# Patient Record
Sex: Female | Born: 2000 | Hispanic: Yes | Marital: Single | Smoking: Never smoker
Health system: Southern US, Community
[De-identification: ages and names within clinical notes are randomized; demographics above are authoritative.]

## PROBLEM LIST (undated history)

## (undated) DIAGNOSIS — J45909 Unspecified asthma, uncomplicated: Secondary | ICD-10-CM

## (undated) HISTORY — PX: APPENDECTOMY: SHX54

---

## 2020-04-28 NOTE — L&D Delivery Note (Addendum)
OB/GYN Faculty Practice Delivery Note  Cheryl Kline is a 20 y.o. G1P0 s/p VAVD at 103w0d. She was admitted for SOL.   ROM: 3h 71m with clear fluid GBS Status: positive>PCN Maximum Maternal Temperature: 98.2  Delivery Date/Time: 1610 Delivery: Called to room and patient was complete and pushing at a +2 station. Vacuum was placed by Dr. Macon Large due to fetal heart rate decelerations.  Head delivered ROA. No nuchal cord present. Shoulder and body delivered in usual fashion. Infant with spontaneous cry, placed on mother's abdomen, dried and stimulated. Cord clamped x 2 after 1-minute delay, and cut by FOB under my direct supervision. Cord blood drawn. Placenta delivered spontaneously with gentle cord traction. Low uterine segment boggy with several clots, evacuated via bimanual examination. Given dose of TXA. Fundus firm after fundal massage and Pitocin administration. Labia, perineum, vagina, and cervix were inspected, first degree laceration. Laceration was repaired with 3-0 vicryl.    Placenta: membranes intact, 3VC Complications: Vacuum assisted delivery 2/2 to fetal heart rate decelerations Lacerations: 1st degree laceration EBL: 200 Analgesia: Epidural and lidocaine  Infant: Baby boy  APGARs 9,9  weight pending  Betti Cruz, MD PGY-2 Family Medicine Resident   Attestation of Attending Supervision of Resident: I was immediately available for direct supervision, assistance and direction throughout this encounter.  I also confirm that I have verified the information documented in the resident's note, and that I personally performed the vacuum assisted vaginal delivery.  Prior to delivery, risks of vacuum assistance were discussed in detail, including but not limited to, bleeding, infection, damage to maternal tissues, fetal cephalohematoma, inability to effect vaginal delivery of the head or shoulder dystocia that cannot be resolved by established maneuvers and need for emergency cesarean  section.  Patient gave verbal consent.  The soft vacuum cup was positioned over the sagittal suture 3 cm anterior to posterior fontanelle.  Pressure was then increased to 500 mmHg, and the patient was instructed to push.  Pulling was administered along the pelvic curve while patient was pushing; there were 3 contractions and no popoffs.  Vacuum was reduced in between contractions.  The infant was then delivered atraumatically as described above. Apgars were 9 and 9. Rest of delivery proceeded as documented in resident's note above.  At the end of the delivery and laceration repair, sponge, instrument and needle counts were correct x 2.  The patient and baby were stable after delivery and remained in couplet care, with plans to transfer later to postpartum unit   Jaynie Collins, MD, FACOG Attending Obstetrician & Gynecologist, [facility] for [facility], [facility]

## 2020-11-02 ENCOUNTER — Telehealth: Payer: Self-pay | Admitting: Radiology

## 2020-11-02 NOTE — Telephone Encounter (Signed)
Called patient to schedule New OB appointment. Patient stated that she lived in [place] and would like a [place] location for prenatal care. Called [facility] at [place] and forwarded paperwork for scheduling appointment there.

## 2020-11-21 ENCOUNTER — Ambulatory Visit (INDEPENDENT_AMBULATORY_CARE_PROVIDER_SITE_OTHER): Payer: BC Managed Care – PPO | Admitting: Women's Health

## 2020-11-21 ENCOUNTER — Other Ambulatory Visit: Payer: Self-pay

## 2020-11-21 ENCOUNTER — Encounter: Payer: Self-pay | Admitting: Women's Health

## 2020-11-21 ENCOUNTER — Other Ambulatory Visit (HOSPITAL_COMMUNITY)
Admission: RE | Admit: 2020-11-21 | Discharge: 2020-11-21 | Disposition: A | Discharge status: Home / Self Care | Payer: BC Managed Care – PPO | Source: Ambulatory Visit | Attending: Women's Health | Admitting: Women's Health

## 2020-11-21 VITALS — BP 103/61 | HR 76 | Ht 59.0 in | Wt 133.0 lb

## 2020-11-21 DIAGNOSIS — J4520 Mild intermittent asthma, uncomplicated: Secondary | ICD-10-CM

## 2020-11-21 DIAGNOSIS — O2342 Unspecified infection of urinary tract in pregnancy, second trimester: Secondary | ICD-10-CM

## 2020-11-21 DIAGNOSIS — O98812 Other maternal infectious and parasitic diseases complicating pregnancy, second trimester: Secondary | ICD-10-CM

## 2020-11-21 DIAGNOSIS — Z3401 Encounter for supervision of normal first pregnancy, first trimester: Secondary | ICD-10-CM | POA: Insufficient documentation

## 2020-11-21 DIAGNOSIS — O0990 Supervision of high risk pregnancy, unspecified, unspecified trimester: Secondary | ICD-10-CM | POA: Insufficient documentation

## 2020-11-21 DIAGNOSIS — Z3A19 19 weeks gestation of pregnancy: Secondary | ICD-10-CM

## 2020-11-21 DIAGNOSIS — J45909 Unspecified asthma, uncomplicated: Secondary | ICD-10-CM | POA: Insufficient documentation

## 2020-11-21 DIAGNOSIS — A749 Chlamydial infection, unspecified: Secondary | ICD-10-CM

## 2020-11-21 DIAGNOSIS — O99012 Anemia complicating pregnancy, second trimester: Secondary | ICD-10-CM

## 2020-11-21 LAB — HEPATITIS C ANTIBODY: HCV Ab: NEGATIVE

## 2020-11-21 NOTE — Progress Notes (Signed)
History:   Cheryl Kline is a 20 y.o. G1P0 at [redacted]w[redacted]d by early ultrasound being seen today for her first obstetrical visit.  Her obstetrical history is significant for  none . Patient does intend to breast feed. Pregnancy history fully reviewed.  Allergies: NKDA Current Medications: albuterol (has not used inhaler in over 1 year, no history of hospitalizations for asthma), PNV PMH: asthma. No HTN, DM. PSH: appendectomy OB Hx: none Social Hx: pt does not smoke, drink, or use drugs. Family Hx: none  Patient reports no complaints.      HISTORY: OB History  Gravida Para Term Preterm AB Living  1 0 0 0 0 0  SAB IAB Ectopic Multiple Live Births  0 0 0 0 0    # Outcome Date GA Lbr Len/2nd Weight Sex Delivery Anes PTL Lv  1 Current             Last pap smear was done never - age 40.  History reviewed. No pertinent past medical history. Past Surgical History:  Procedure Laterality Date   APPENDECTOMY     Family History  Problem Relation Age of Onset   Cancer Father    Cancer Paternal Grandmother    Social History   Tobacco Use   Smoking status: Never   Smokeless tobacco: Never  Substance Use Topics   Alcohol use: Not Currently   Drug use: Not Currently   Not on File Current Outpatient Medications on File Prior to Visit  Medication Sig Dispense Refill   albuterol (VENTOLIN HFA) 108 (90 Base) MCG/ACT inhaler Inhale into the lungs every 6 (six) hours as needed for wheezing or shortness of breath.     prenatal vitamin w/FE, FA (PRENATAL 1 + 1) 27-1 MG TABS tablet Take 1 tablet by mouth daily at 12 noon.     No current facility-administered medications on file prior to visit.    Review of Systems Pertinent items noted in HPI and remainder of comprehensive ROS otherwise negative.  Physical Exam:   Vitals:   11/21/20 1518 11/21/20 1521  BP: 103/61   Pulse: 76   Weight: 133 lb (60.3 kg)   Height:  4\' 11"  (1.499 m)   Fetal Heart Rate (bpm): 145  General:  well-developed, well-nourished female in no acute distress  Breasts:  normal appearance, no masses or tenderness bilaterally  Skin: normal coloration and turgor, no rashes  Neurologic: oriented, normal, negative, normal mood  Extremities: normal strength, tone, and muscle mass, ROM of all joints is normal  HEENT PERRLA, extraocular movement intact and sclera clear, anicteric  Neck supple and no masses  Cardiovascular: regular rate and rhythm  Respiratory:  no respiratory distress, normal breath sounds  Abdomen: soft, non-tender; bowel sounds normal; no masses,  no organomegaly  Pelvic: normal external genitalia. Uterine size:  c/w dates    Assessment:    Pregnancy: G1P0 Patient Active Problem List   Diagnosis Date Noted   Encounter for supervision of normal first pregnancy in first trimester 11/21/2020   Asthma 11/21/2020      Plan:    1. Encounter for supervision of normal first pregnancy in first trimester - Obstetric Panel, Including HIV - Hepatitis C antibody - Urine Culture - AFP  2. [redacted] weeks gestation of pregnancy  Initial labs drawn. Continue prenatal vitamins. Problem list reviewed and updated. Genetic Screening not covered by [employer] Ultrasound discussed; fetal anatomic survey: ordered. Anticipatory guidance about prenatal visits given including labs, ultrasounds, and testing. Discussed usage of [employer]  and virtual visits as additional source of managing and completing prenatal visits in midst of coronavirus and pandemic.   Encouraged to complete MyChart Registration for her ability to review results, send requests, and have questions addressed.  The nature of [facility] with multiple MDs and Advanced Practice Providers was explained to patient; also emphasized that residents, students are part of our team. Routine obstetric precautions reviewed. Encouraged to seek out care at office or emergency room [facility]  preferred) for urgent and/or emergent concerns. Return in about 4 weeks (around 12/19/2020) for in-person LOB/APP OK, needs anatomy scan with [place].     Zera Markwardt, Odie Sera, NP  3:54 PM 11/21/2020

## 2020-11-21 NOTE — Patient Instructions (Addendum)
Maternity Assessment Unit (MAU)  The Maternity Assessment Unit (MAU) is located at the [facility] at [facility]. The address is: [address], [place], [place], [place] [address]. Please see map below for additional directions.    The Maternity Assessment Unit is designed to help you during your pregnancy, and for up to 6 weeks after delivery, with any pregnancy- or postpartum-related emergencies, if you think you are in labor, or if your water has broken. For example, if you experience nausea and vomiting, vaginal bleeding, severe abdominal or pelvic pain, elevated blood pressure or other problems related to your pregnancy or postpartum time, please come to the Maternity Assessment Unit for assistance.                        Safe Medications in Pregnancy    Acne: Benzoyl Peroxide Salicylic Acid  Backache/Headache: Tylenol: 2 regular strength every 4 hours OR              2 Extra strength every 6 hours  Colds/Coughs/Allergies: Benadryl (alcohol free) 25 mg every 6 hours as needed Breath right strips Claritin Cepacol throat lozenges Chloraseptic throat spray Cold-Eeze- up to three times per day Cough drops, alcohol free Flonase (by prescription only) Guaifenesin Mucinex Robitussin DM (plain only, alcohol free) Saline nasal spray/drops Sudafed (pseudoephedrine) & Actifed ** use only after [redacted] weeks gestation and if you do not have high blood pressure Tylenol Vicks Vaporub Zinc lozenges Zyrtec   Constipation: Colace Ducolax suppositories Fleet enema Glycerin suppositories Metamucil Milk of magnesia Miralax Senokot Smooth move tea  Diarrhea: Kaopectate Imodium A-D  *NO pepto Bismol  Hemorrhoids: Anusol Anusol HC Preparation H Tucks  Indigestion: Tums Maalox Mylanta Zantac  Pepcid  Insomnia: Benadryl (alcohol free) 25mg  every 6 hours as needed Tylenol PM Unisom, no Gelcaps  Leg  Cramps: Tums MagGel  Nausea/Vomiting:  Bonine Dramamine Emetrol Ginger extract Sea bands Meclizine  Nausea medication to take during pregnancy:  Unisom (doxylamine succinate 25 mg tablets) Take one tablet daily at bedtime. If symptoms are not adequately controlled, the dose can be increased to a maximum recommended dose of two tablets daily (1/2 tablet in the morning, 1/2 tablet mid-afternoon and one at bedtime). Vitamin B6 100mg  tablets. Take one tablet twice a day (up to 200 mg per day).  Skin Rashes: Aveeno products Benadryl cream or 25mg  every 6 hours as needed Calamine Lotion 1% cortisone cream  Yeast infection: Gyne-lotrimin 7 Monistat 7   **If taking multiple medications, please check labels to avoid duplicating the same active ingredients **take medication as directed on the label ** Do not exceed 4000 mg of tylenol in 24 hours **Do not take medications that contain aspirin or ibuprofen               Alpha-Fetoprotein Test Why am I having this test? The alpha-fetoprotein test is a lab test most commonly used for pregnant women to help screen for birth defects in their unborn baby. It can be used to screen for chromosome (DNA) abnormalities, problems with the brain or spinal cord, or problems with the abdominal wall of the unborn baby (fetus). The alpha-fetoprotein test may also be done for men or nonpregnant women tocheck for certain cancers. What is being tested? This test measures the amount of alpha-fetoprotein (AFP) in your blood. AFP is a protein that is made by the liver. Levels can be detected in the mother's blood during pregnancy, starting at 10  weeks and peaking at 16-18 weeks of the pregnancy. Abnormal levels can sometimes be a sign of a birth defect in thebaby. Certain cancers can cause a high level of AFP in men and nonpregnant women. What  kind of sample is taken?  A blood sample is required for this test. It is usually collected by insertinga needle into a blood vessel. How are the results reported? Your test results will be reported as values. Your health care provider will compare your results to normal ranges that were established after testing a large group of people (reference values). Reference values may vary among labs and hospitals. For this test, common reference values are: Adult: Less than 40 ng/mL or less than 40 mcg/L (SI units). Child younger than 1 year: Less than 30 ng/mL. If you are pregnant, the values may also vary based on how long you have beenpregnant. What do the results mean? Results that are above the reference values in pregnant women may indicate the following for the baby: Neural tube defects, such as abnormalities of the spinal cord or brain. Abdominal wall defects. Multiple pregnancy such as twins. Fetal distress or fetal death. Results that are above the reference values in men or nonpregnant women may indicate: Reproductive cancers, such as ovarian or testicular cancer. Liver cancer. Liver cell death. Other types of cancer. Very low levels of AFP in pregnant women may indicate Down syndrome for thebaby. Talk with your health care provider about what your results mean. Questions to ask your health care provider Ask your health care provider, or the department that is doing the test: When will my results be ready? How will I get my results? What are my treatment options? What other tests do I need? What are my next steps? Summary The alpha-fetoprotein test is done on pregnant women to help screen for birth defects in their unborn baby. Certain cancers can cause a high level of AFP in men and nonpregnant women. For this test, a blood sample is usually collected by inserting a needle into a blood vessel. Talk with your health care provider about what your results mean. This information is  not intended to replace advice given to you by your health care provider. Make sure you discuss any questions you have with your healthcare provider. Document Revised: 11/04/2019 Document Reviewed: 11/04/2019 Elsevier Patient Education  2022 [employer].       Second Trimester of Pregnancy  The second trimester of pregnancy is from week 13 through week 27. This is months 4 through 6 of pregnancy. The second trimester is often a time when you feel your best. Your body has adjusted to being pregnant, and you begin to feelbetter physically. During the second trimester: Morning sickness has lessened or stopped completely. You may have more energy. You may have an increase in appetite. The second trimester is also a time when the unborn baby (fetus) is growing rapidly. At the end of the sixth month, the fetus may be up to 12 inches long and weigh about 1 pounds. You will likely begin to feel the baby move (quickening) between 16 and 20 weeks of pregnancy. Body changes during your second trimester Your body continues to go through many changes during your second trimester.The changes vary and generally return to normal after the baby is born. Physical changes Your weight will continue to increase. You will notice your lower abdomen bulging out. You may begin to get stretch marks on your hips, abdomen, and breasts. Your breasts will continue  to grow and to become tender. Dark spots or blotches (chloasma or mask of pregnancy) may develop on your face. A dark line from your belly button to the pubic area (linea nigra) may appear. You may have changes in your hair. These can include thickening of your hair, rapid growth, and changes in texture. Some people also have hair loss during or after pregnancy, or hair that feels dry or thin. Health changes You may develop headaches. You may have heartburn. You may develop constipation. You may develop hemorrhoids or swollen, bulging veins (varicose  veins). Your gums may bleed and may be sensitive to brushing and flossing. You may urinate more often because the fetus is pressing on your bladder. You may have back pain. This is caused by: Weight gain. Pregnancy hormones that are relaxing the joints in your pelvis. A shift in weight and the muscles that support your balance. Follow these instructions at home: Medicines Follow your health care provider's instructions regarding medicine use. Specific medicines may be either safe or unsafe to take during pregnancy. Do not take any medicines unless approved by your health care provider. Take a prenatal vitamin that contains at least 600 micrograms (mcg) of folic acid. Eating and drinking Eat a healthy diet that includes fresh fruits and vegetables, whole grains, good sources of protein such as meat, eggs, or tofu, and low-fat dairy products. Avoid raw meat and unpasteurized juice, milk, and cheese. These carry germs that can harm you and your baby. You may need to take these actions to prevent or treat constipation: Drink enough fluid to keep your urine pale yellow. Eat foods that are high in fiber, such as beans, whole grains, and fresh fruits and vegetables. Limit foods that are high in fat and processed sugars, such as fried or sweet foods. Activity Exercise only as directed by your health care provider. Most people can continue their usual exercise routine during pregnancy. Try to exercise for 30 minutes at least 5 days a week. Stop exercising if you develop contractions in your uterus. Stop exercising if you develop pain or cramping in the lower abdomen or lower back. Avoid exercising if it is very hot or humid or if you are at a high altitude. Avoid heavy lifting. If you choose to, you may have sex unless your health care provider tells you not to. Relieving pain and discomfort Wear a supportive bra to prevent discomfort from breast tenderness. Take warm sitz baths to soothe any pain  or discomfort caused by hemorrhoids. Use hemorrhoid cream if your health care provider approves. Rest with your legs raised (elevated) if you have leg cramps or low back pain. If you develop varicose veins: Wear support hose as told by your health care provider. Elevate your feet for 15 minutes, 3-4 times a day. Limit salt in your diet. Safety Wear your seat belt at all times when driving or riding in a car. Talk with your health care provider if someone is verbally or physically abusive to you. Lifestyle Do not use hot tubs, steam rooms, or saunas. Do not douche. Do not use tampons or scented sanitary pads. Avoid cat litter boxes and soil used by cats. These carry germs that can cause birth defects in the baby and possibly loss of the fetus by miscarriage or stillbirth. Do not use herbal remedies, alcohol, illegal drugs, or medicines that are not approved by your health care provider. Chemicals in these products can harm your baby. Do not use any products that contain nicotine  or tobacco, such as cigarettes, e-cigarettes, and chewing tobacco. If you need help quitting, ask your health care provider. General instructions During a routine prenatal visit, your health care provider will do a physical exam and other tests. He or she will also discuss your overall health. Keep all follow-up visits. This is important. Ask your health care provider for a referral to a local prenatal education class. Ask for help if you have counseling or nutritional needs during pregnancy. Your health care provider can offer advice or refer you to specialists for help with various needs. Where to find more information American Pregnancy Association: americanpregnancy.org [employer]ologists: https://www.todd-brady.net/ Office on [employer] Health: MightyReward.co.nz Contact a health care provider if you have: A headache that does not go away when you take  medicine. Vision changes or you see spots in front of your eyes. Mild pelvic cramps, pelvic pressure, or nagging pain in the abdominal area. Persistent nausea, vomiting, or diarrhea. A bad-smelling vaginal discharge or foul-smelling urine. Pain when you urinate. Sudden or extreme swelling of your face, hands, ankles, feet, or legs. A fever. Get help right away if you: Have fluid leaking from your vagina. Have spotting or bleeding from your vagina. Have severe abdominal cramping or pain. Have difficulty breathing. Have chest pain. Have fainting spells. Have not felt your baby move for the time period told by your health care provider. Have new or increased pain, swelling, or redness in an arm or leg. Summary The second trimester of pregnancy is from week 13 through week 27 (months 4 through 6). Do not use herbal remedies, alcohol, illegal drugs, or medicines that are not approved by your health care provider. Chemicals in these products can harm your baby. Exercise only as directed by your health care provider. Most people can continue their usual exercise routine during pregnancy. Keep all follow-up visits. This is important. This information is not intended to replace advice given to you by your health care provider. Make sure you discuss any questions you have with your healthcare provider. Document Revised: 09/21/2019 Document Reviewed: 07/28/2019 Elsevier Patient Education  2022 Elsevier Inc.       Preterm Labor The normal length of a pregnancy is 39-41 weeks. Preterm labor is when labor starts before 37 completed weeks of pregnancy. Babies who are born prematurely and survive may not be fully developed and may be at an increased risk for long-term problems such as cerebral palsy, developmental delays, and vision andhearing problems. Babies who are born too early may have problems soon after birth. Premature babies may have problems regulating blood sugar, body temperature,  heart rate, and breathing rate. These babies often have trouble with feeding. The risk ofhaving problems is highest for babies who are born before 34 weeks of pregnancy. What are the causes? The exact cause of this condition is not known. What increases the risk? You are more likely to have preterm labor if you have certain risk factors that relate to your medical history, problems with present and past pregnancies, andlifestyle factors. Medical history You have abnormalities of the uterus, including a short cervix. You have STIs (sexually transmitted infections) or other infections of the urinary tract and the vagina. You have chronic illnesses, such as blood clotting problems, diabetes, or high blood pressure. You are overweight or underweight. Present and past pregnancies You have had preterm labor before. You are pregnant with twins or other multiples. You have been diagnosed with a condition in which the placenta covers your  cervix (placenta previa). You waited less than 18 months between giving birth and becoming pregnant again. Your unborn baby has some abnormalities. You have vaginal bleeding during pregnancy. You became pregnant through in vitro fertilization (IVF). Lifestyle and environmental factors You use tobacco products or drink alcohol. You use drugs. You have stress and no social support. You experience domestic violence. You are exposed to certain chemicals or environmental pollutants. Other factors You are younger than age 35 or older than age 86. What are the signs or symptoms? Symptoms of this condition include: Cramps similar to those that can happen during a menstrual period. The cramps may happen with diarrhea. Pain in the abdomen or lower back. Regular contractions that may feel like tightening of the abdomen. A feeling of increased pressure in the pelvis. Increased watery or bloody mucus discharge from the vagina. Water breaking (ruptured amniotic sac). How  is this diagnosed? This condition is diagnosed based on: Your medical history and a physical exam. A pelvic exam. An ultrasound. Monitoring your uterus for contractions. Other tests, including: A swab of the cervix to check for a chemical called fetal fibronectin. Urine tests. How is this treated? Treatment for this condition depends on the length of your pregnancy, your condition, and the health of your baby. Treatment may include: Taking medicines, such as: Hormone medicines. These may be given early in pregnancy to help support the pregnancy. Medicines to stop contractions. Medicines to help mature the baby's lungs. These may be prescribed if the risk of delivery is high. Medicines to help protect your baby from brain and nerve complications such as cerebral palsy. Bed rest. If the labor happens before 34 weeks of pregnancy, you may need to stay in the hospital. Delivery of the baby. Follow these instructions at home:  Do not use any products that contain nicotine or tobacco. These products include cigarettes, chewing tobacco, and vaping devices, such as e-cigarettes. If you need help quitting, ask your health care provider. Do not drink alcohol. Take over-the-counter and prescription medicines only as told by your health care provider. Rest as told by your health care provider. Return to your normal activities as told by your health care provider. Ask your health care provider what activities are safe for you. Keep all follow-up visits. This is important. How is this prevented? To increase your chance of having a full-term pregnancy: Do not use drugs or take medicines that have not been prescribed to you during your pregnancy. Talk with your health care provider before taking any herbal supplements, even if you have been taking them regularly. Make sure you gain a healthy amount of weight during your pregnancy. Watch for infection. If you think that you might have an infection, get  it checked right away. Symptoms of infection may include: Fever. Abnormal vaginal discharge or discharge that smells bad. Pain or burning with urination. Needing to urinate urgently. Frequently urinating or passing small amounts of urine frequently. Blood in your urine or urine that smells bad or unusual. Where to find more information U.S. Department of Health and Cytogeneticist on Women's Health: http://hoffman.com/ [employer]: www.acog.org Centers for Disease Control and Prevention, Preterm Birth: FootballExhibition.com.br Contact a health care provider if: You think you are going into preterm labor. You have signs or symptoms of preterm labor. You have symptoms of infection. Get help right away if: You are having regular, painful contractions every 5 minutes or less. Your water breaks. Summary Preterm labor is labor  that starts before you reach 37 weeks of pregnancy. Delivering your baby early increases your baby's risk of developing long-term problems. You are more likely to have preterm labor if you have certain risk factors that relate to your medical history, problems with present and past pregnancies, and lifestyle factors. Keep all follow-up visits. This is important. Contact a health care provider if you have signs or symptoms of preterm labor. This information is not intended to replace advice given to you by your health care provider. Make sure you discuss any questions you have with your healthcare provider. Document Revised: 04/17/2020 Document Reviewed: 04/17/2020 Elsevier Patient Education  2022 [employer]

## 2020-11-21 NOTE — Progress Notes (Signed)
Pt states that she is nauseous daily, no current treatment.

## 2020-11-22 ENCOUNTER — Inpatient Hospital Stay (HOSPITAL_BASED_OUTPATIENT_CLINIC_OR_DEPARTMENT_OTHER): Payer: BC Managed Care – PPO

## 2020-11-22 ENCOUNTER — Inpatient Hospital Stay (HOSPITAL_COMMUNITY)
Admission: AD | Admit: 2020-11-22 | Discharge: 2020-11-22 | Disposition: A | Discharge status: Home / Self Care | Payer: BC Managed Care – PPO | Attending: Obstetrics and Gynecology | Admitting: Obstetrics and Gynecology

## 2020-11-22 ENCOUNTER — Encounter (HOSPITAL_COMMUNITY): Payer: Self-pay | Admitting: Obstetrics and Gynecology

## 2020-11-22 DIAGNOSIS — O4692 Antepartum hemorrhage, unspecified, second trimester: Secondary | ICD-10-CM

## 2020-11-22 DIAGNOSIS — O209 Hemorrhage in early pregnancy, unspecified: Secondary | ICD-10-CM | POA: Insufficient documentation

## 2020-11-22 DIAGNOSIS — O99512 Diseases of the respiratory system complicating pregnancy, second trimester: Secondary | ICD-10-CM | POA: Diagnosis not present

## 2020-11-22 DIAGNOSIS — N390 Urinary tract infection, site not specified: Secondary | ICD-10-CM | POA: Insufficient documentation

## 2020-11-22 DIAGNOSIS — O2342 Unspecified infection of urinary tract in pregnancy, second trimester: Secondary | ICD-10-CM

## 2020-11-22 DIAGNOSIS — Z3A19 19 weeks gestation of pregnancy: Secondary | ICD-10-CM

## 2020-11-22 DIAGNOSIS — Z369 Encounter for antenatal screening, unspecified: Secondary | ICD-10-CM | POA: Diagnosis not present

## 2020-11-22 DIAGNOSIS — J45909 Unspecified asthma, uncomplicated: Secondary | ICD-10-CM | POA: Diagnosis not present

## 2020-11-22 DIAGNOSIS — Z79899 Other long term (current) drug therapy: Secondary | ICD-10-CM | POA: Insufficient documentation

## 2020-11-22 HISTORY — DX: Unspecified asthma, uncomplicated: J45.909

## 2020-11-22 LAB — CERVICOVAGINAL ANCILLARY ONLY
Chlamydia: POSITIVE — AB
Comment: NEGATIVE
Comment: NORMAL
Neisseria Gonorrhea: NEGATIVE

## 2020-11-22 LAB — CBC
HCT: 28.3 % — ABNORMAL LOW (ref 36.0–46.0)
Hemoglobin: 9.4 g/dL — ABNORMAL LOW (ref 12.0–15.0)
MCH: 28.5 pg (ref 26.0–34.0)
MCHC: 33.2 g/dL (ref 30.0–36.0)
MCV: 85.8 fL (ref 80.0–100.0)
Platelets: 297 10*3/uL (ref 150–400)
RBC: 3.3 MIL/uL — ABNORMAL LOW (ref 3.87–5.11)
RDW: 14.3 % (ref 11.5–15.5)
WBC: 7.2 10*3/uL (ref 4.0–10.5)
nRBC: 0 % (ref 0.0–0.2)

## 2020-11-22 LAB — URINALYSIS, ROUTINE W REFLEX MICROSCOPIC
Bilirubin Urine: NEGATIVE
Glucose, UA: NEGATIVE mg/dL
Ketones, ur: NEGATIVE mg/dL
Nitrite: NEGATIVE
Protein, ur: 30 mg/dL — AB
Specific Gravity, Urine: 1.026 (ref 1.005–1.030)
pH: 6 (ref 5.0–8.0)

## 2020-11-22 LAB — ABO/RH: ABO/RH(D): O POS

## 2020-11-22 LAB — WET PREP, GENITAL
Clue Cells Wet Prep HPF POC: NONE SEEN
Sperm: NONE SEEN
Trich, Wet Prep: NONE SEEN

## 2020-11-22 MED ORDER — CEFADROXIL 500 MG PO CAPS: 500.0000 mg | ORAL_CAPSULE | Freq: Two times a day (BID) | ORAL | 0 refills | Status: DC

## 2020-11-22 NOTE — MAU Provider Note (Addendum)
Chief Complaint:  Vaginal Bleeding and Abdominal Pain   Event Date/Time   First Provider Initiated Contact with Patient 11/22/20 [phone]     HPI: Cheryl Kline is a 20 y.o. G1P0 at 45w6dwho presents to maternity admissions reporting onset of sharp pelvic pain, vaginal bleeding and dysuria this morning.  States it was a lot with wiping but did not wear a pad. . She reports good fetal movement, denies LOF, vaginal bleeding, vaginal itching/burning, urinary symptoms, h/a, dizziness, n/v, diarrhea, constipation or fever/chills.  She denies headache, visual changes or RUQ abdominal pain.  Vaginal Bleeding The patient's primary symptoms include pelvic pain and vaginal bleeding. The patient's pertinent negatives include no genital itching, genital lesions or genital odor. This is a new problem. The current episode started today. She is pregnant. Associated symptoms include abdominal pain and dysuria. Pertinent negatives include no back pain, chills, constipation, diarrhea, fever or headaches. The vaginal discharge was bloody. The vaginal bleeding is lighter than menses. She has not been passing clots. She has not been passing tissue. Nothing aggravates the symptoms. She has tried nothing for the symptoms.  Abdominal Pain This is a new problem. The current episode started today. The pain is located in the RLQ and suprapubic region. The quality of the pain is described as cramping and sharp. The pain does not radiate. Associated symptoms include dysuria. Pertinent negatives include no constipation, diarrhea, fever or headaches. Nothing relieves the symptoms. Past treatments include nothing.   RN Note: Went to [facility] this am and had bright bleeding. Also having sharp, constant pain in RLQ.   Past Medical History: Past Medical History:  Diagnosis Date   Asthma     Past obstetric history: OB History  Gravida Para Term Preterm AB Living  1            SAB IAB Ectopic Multiple Live Births               #  Outcome Date GA Lbr Len/2nd Weight Sex Delivery Anes PTL Lv  1 Current             Past Surgical History: Past Surgical History:  Procedure Laterality Date   APPENDECTOMY      Family History: Family History  Problem Relation Age of Onset   Cancer Father    Cancer Paternal Grandmother     Social History: Social History   Tobacco Use   Smoking status: Never   Smokeless tobacco: Never  Substance Use Topics   Alcohol use: Not Currently   Drug use: Not Currently    Allergies: Not on File  Meds:  Medications Prior to Admission  Medication Sig Dispense Refill Last Dose   prenatal vitamin w/FE, FA (PRENATAL 1 + 1) 27-1 MG TABS tablet Take 1 tablet by mouth daily at 12 noon.   11/21/2020   albuterol (VENTOLIN HFA) 108 (90 Base) MCG/ACT inhaler Inhale into the lungs every 6 (six) hours as needed for wheezing or shortness of breath.   Unknown    I have reviewed patient's Past Medical Hx, Surgical Hx, Family Hx, Social Hx, medications and allergies.   ROS:  Review of Systems  Constitutional:  Negative for chills and fever.  Gastrointestinal:  Positive for abdominal pain. Negative for constipation and diarrhea.  Genitourinary:  Positive for dysuria, pelvic pain and vaginal bleeding.  Musculoskeletal:  Negative for back pain.  Neurological:  Negative for headaches.  Other systems negative  Physical Exam  Patient Vitals for the past 24 hrs:  BP  Temp Pulse Resp Height Weight  11/22/20 0702 120/86 -- 81 -- -- --  11/22/20 0659 -- 98.1 F (36.7 C) -- 17 4\' 11"  (1.499 m) 61.2 kg   Constitutional: Well-developed, well-nourished female in no acute distress.  Cardiovascular: normal rate and rhythm Respiratory: normal effort, clear to auscultation bilaterally GI: Abd soft, non-tender, gravid appropriate for gestational age.   No rebound or guarding. MS: Extremities nontender, no edema, normal ROM Neurologic: Alert and oriented x 4.  GU: Neg CVAT.  PELVIC EXAM: Cervix pink,  visually closed, without lesion, moderate tan/blood tinged thick discharge, vaginal walls and external genitalia normal  Cervix closed and long.  Tender to exam   Wet prep sent.  Cultures done in office yesterday.  FHT:  143   Labs: Results for orders placed or performed during the hospital encounter of 11/22/20 (from the past 24 hour(s))  Urinalysis, Routine w reflex microscopic Urine, Clean Catch     Status: Abnormal   Collection Time: 11/22/20  7:05 AM  Result Value Ref Range   Color, Urine AMBER (A) YELLOW   APPearance CLOUDY (A) CLEAR   Specific Gravity, Urine 1.026 1.005 - 1.030   pH 6.0 5.0 - 8.0   Glucose, UA NEGATIVE NEGATIVE mg/dL   Hgb urine dipstick MODERATE (A) NEGATIVE   Bilirubin Urine NEGATIVE NEGATIVE   Ketones, ur NEGATIVE NEGATIVE mg/dL   Protein, ur 30 (A) NEGATIVE mg/dL   Nitrite NEGATIVE NEGATIVE   Leukocytes,Ua LARGE (A) NEGATIVE   RBC / HPF 21-50 0 - 5 RBC/hpf   WBC, UA 21-50 0 - 5 WBC/hpf   Bacteria, UA MANY (A) NONE SEEN   Squamous Epithelial / LPF 21-50 0 - 5   Mucus PRESENT    Non Squamous Epithelial 0-5 (A) NONE SEEN  Wet prep, genital     Status: Abnormal   Collection Time: 11/22/20  7:39 AM   Specimen: Vaginal  Result Value Ref Range   Yeast Wet Prep HPF POC PRESENT (A) NONE SEEN   Trich, Wet Prep NONE SEEN NONE SEEN   Clue Cells Wet Prep HPF POC NONE SEEN NONE SEEN   WBC, Wet Prep HPF POC MANY (A) NONE SEEN   Sperm NONE SEEN   CBC     Status: Abnormal   Collection Time: 11/22/20  8:07 AM  Result Value Ref Range   WBC 7.2 4.0 - 10.5 K/uL   RBC 3.30 (L) 3.87 - 5.11 MIL/uL   Hemoglobin 9.4 (L) 12.0 - 15.0 g/dL   HCT 11/24/20 (L) 56.4 - 33.2 %   MCV 85.8 80.0 - 100.0 fL   MCH 28.5 26.0 - 34.0 pg   MCHC 33.2 30.0 - 36.0 g/dL   RDW 95.1 88.4 - 16.6 %   Platelets 297 150 - 400 K/uL   nRBC 0.0 0.0 - 0.2 %  ABO/Rh     Status: None   Collection Time: 11/22/20  8:07 AM  Result Value Ref Range   ABO/RH(D) O POS    No rh immune globuloin      NOT A  RH IMMUNE GLOBULIN CANDIDATE, PT RH POSITIVE Performed at [facility], [address], [place], [place]    Imaging:  No results found.  MAU Course/MDM: I have ordered labs and sent a wet prep [id number] ordered to assess for placental location Report given to oncoming provider   [place] CNM, MSN Certified Nurse-Midwife  Wynelle Bourgeois reviewed: no previa. CL 4cm.  UA suggestive of UTI and pt  symptomatic, will treat and send UC.  Stable for discharge home.  Assessment: 1. [redacted] weeks gestation of pregnancy   2. Urinary tract infection in mother during second trimester of pregnancy   3. Vaginal bleeding in pregnancy, second trimester    Plan: Discharge home Follow up at [facility] as scheduled Rx Duricef Return precautions  Allergies as of 11/22/2020   Not on File      Medication List     TAKE these medications    albuterol 108 (90 Base) MCG/ACT inhaler Commonly known as: VENTOLIN HFA Inhale into the lungs every 6 (six) hours as needed for wheezing or shortness of breath.   cefadroxil 500 MG capsule Commonly known as: DURICEF Take 1 capsule (500 mg total) by mouth 2 (two) times daily.   prenatal vitamin w/FE, FA 27-1 MG Tabs tablet Take 1 tablet by mouth daily at 12 noon.       Donette Larry, CNM  11/22/2020 10:02 AM

## 2020-11-22 NOTE — MAU Note (Signed)
Went to [facility] this am and had bright bleeding. Also having sharp, constant pain in RLQ.

## 2020-11-23 LAB — OBSTETRIC PANEL, INCLUDING HIV
Antibody Screen: NEGATIVE
Basophils Absolute: 0.1 10*3/uL (ref 0.0–0.2)
Basos: 1 %
EOS (ABSOLUTE): 0.1 10*3/uL (ref 0.0–0.4)
Eos: 2 %
HIV Screen 4th Generation wRfx: NONREACTIVE
Hematocrit: 31.2 % — ABNORMAL LOW (ref 34.0–46.6)
Hemoglobin: 9.9 g/dL — ABNORMAL LOW (ref 11.1–15.9)
Hepatitis B Surface Ag: NEGATIVE
Immature Grans (Abs): 0 10*3/uL (ref 0.0–0.1)
Immature Granulocytes: 0 %
Lymphocytes Absolute: 1.9 10*3/uL (ref 0.7–3.1)
Lymphs: 26 %
MCH: 27 pg (ref 26.6–33.0)
MCHC: 31.7 g/dL (ref 31.5–35.7)
MCV: 85 fL (ref 79–97)
Monocytes Absolute: 0.5 10*3/uL (ref 0.1–0.9)
Monocytes: 6 %
Neutrophils Absolute: 4.7 10*3/uL (ref 1.4–7.0)
Neutrophils: 65 %
Platelets: 353 10*3/uL (ref 150–450)
RBC: 3.66 x10E6/uL — ABNORMAL LOW (ref 3.77–5.28)
RDW: 13.6 % (ref 11.7–15.4)
RPR Ser Ql: NONREACTIVE
Rh Factor: POSITIVE
Rubella Antibodies, IGG: 1.14 index (ref >0.99)
WBC: 7.3 10*3/uL (ref 3.4–10.8)

## 2020-11-23 LAB — AFP, SERUM, OPEN SPINA BIFIDA
AFP MoM: 0.98
AFP Value: 55.3 ng/mL
Gest. Age on Collection Date: 19 weeks
Maternal Age At EDD: 22.1 yr
OSBR Risk 1 IN: 10000
Test Results:: NEGATIVE
Weight: 133 [lb_av]

## 2020-11-23 LAB — HEPATITIS C ANTIBODY: Hep C Virus Ab: 0.2 s/co ratio (ref 0.0–0.9)

## 2020-11-24 LAB — CULTURE, OB URINE: Culture: >=100000 — AB

## 2020-11-24 LAB — URINE CULTURE

## 2020-11-25 DIAGNOSIS — O99019 Anemia complicating pregnancy, unspecified trimester: Secondary | ICD-10-CM | POA: Insufficient documentation

## 2020-11-25 DIAGNOSIS — O2340 Unspecified infection of urinary tract in pregnancy, unspecified trimester: Secondary | ICD-10-CM | POA: Insufficient documentation

## 2020-11-25 DIAGNOSIS — A749 Chlamydial infection, unspecified: Secondary | ICD-10-CM | POA: Insufficient documentation

## 2020-11-26 NOTE — Progress Notes (Signed)
Please call pt to alert to +CT and diagnosis of anemia and treat per protocol. Patient has already been treated for UTI.  Thank you, Joni Reining

## 2020-12-07 ENCOUNTER — Other Ambulatory Visit: Payer: Self-pay

## 2020-12-07 MED ORDER — FERROUS SULFATE 325 (65 FE) MG PO TABS: 325.0000 mg | ORAL_TABLET | Freq: Two times a day (BID) | ORAL | 1 refills | Status: DC

## 2020-12-19 ENCOUNTER — Other Ambulatory Visit: Payer: Self-pay

## 2020-12-19 ENCOUNTER — Ambulatory Visit (INDEPENDENT_AMBULATORY_CARE_PROVIDER_SITE_OTHER): Payer: Medicaid Other | Admitting: Women's Health

## 2020-12-19 VITALS — BP 112/73 | HR 80 | Wt 140.0 lb

## 2020-12-19 DIAGNOSIS — Z3A23 23 weeks gestation of pregnancy: Secondary | ICD-10-CM

## 2020-12-19 DIAGNOSIS — O98812 Other maternal infectious and parasitic diseases complicating pregnancy, second trimester: Secondary | ICD-10-CM

## 2020-12-19 DIAGNOSIS — O99012 Anemia complicating pregnancy, second trimester: Secondary | ICD-10-CM

## 2020-12-19 DIAGNOSIS — O2342 Unspecified infection of urinary tract in pregnancy, second trimester: Secondary | ICD-10-CM

## 2020-12-19 DIAGNOSIS — A749 Chlamydial infection, unspecified: Secondary | ICD-10-CM

## 2020-12-19 DIAGNOSIS — Z3401 Encounter for supervision of normal first pregnancy, first trimester: Secondary | ICD-10-CM

## 2020-12-19 MED ORDER — AZITHROMYCIN 500 MG PO TABS: 1000.0000 mg | ORAL_TABLET | Freq: Every day | ORAL | 0 refills | Status: AC

## 2020-12-19 MED ORDER — FERROUS SULFATE 324 (65 FE) MG PO TBEC: 1.0000 | DELAYED_RELEASE_TABLET | ORAL | 3 refills | Status: DC

## 2020-12-19 NOTE — Progress Notes (Signed)
Pt reports fetal movement, denies pain.  

## 2020-12-19 NOTE — Patient Instructions (Addendum)
Maternity Assessment Unit (MAU)  The Maternity Assessment Unit (MAU) is located at the [facility] and [place] at [facility]. The address is: [address] [place], [place], [place] [address]. Please see map below for additional directions.    The Maternity Assessment Unit is designed to help you during your pregnancy, and for up to 6 weeks after delivery, with any pregnancy- or postpartum-related emergencies, if you think you are in labor, or if your water has broken. For example, if you experience nausea and vomiting, vaginal bleeding, severe abdominal or pelvic pain, elevated blood pressure or other problems related to your pregnancy or postpartum time, please come to the Maternity Assessment Unit for assistance.       Preterm Labor The normal length of a pregnancy is 39-41 weeks. Preterm labor is when labor starts before 37 completed weeks of pregnancy. Babies who are born prematurely and survive may not be fully developed and may be at an increased risk for long-term problems such as cerebral palsy, developmental delays, and vision andhearing problems. Babies who are born too early may have problems soon after birth. Premature babies may have problems regulating blood sugar, body temperature, heart rate, and breathing rate. These babies often have trouble with feeding. The risk ofhaving problems is highest for babies who are born before 39 weeks of pregnancy. What are the causes? The exact cause of this condition is not known. What increases the risk? You are more likely to have preterm labor if you have certain risk factors that relate to your medical history, problems with present and past pregnancies, andlifestyle factors. Medical history You have abnormalities of the uterus, including a short cervix. You have STIs (sexually transmitted infections) or other infections of the urinary tract and the vagina. You have chronic illnesses, such as blood clotting  problems, diabetes, or high blood pressure. You are overweight or underweight. Present and past pregnancies You have had preterm labor before. You are pregnant with twins or other multiples. You have been diagnosed with a condition in which the placenta covers your cervix (placenta previa). You waited less than 18 months between giving birth and becoming pregnant again. Your unborn baby has some abnormalities. You have vaginal bleeding during pregnancy. You became pregnant through in vitro fertilization (IVF). Lifestyle and environmental factors You use tobacco products or drink alcohol. You use drugs. You have stress and no social support. You experience domestic violence. You are exposed to certain chemicals or environmental pollutants. Other factors You are younger than age 94 or older than age 43. What are the signs or symptoms? Symptoms of this condition include: Cramps similar to those that can happen during a menstrual period. The cramps may happen with diarrhea. Pain in the abdomen or lower back. Regular contractions that may feel like tightening of the abdomen. A feeling of increased pressure in the pelvis. Increased watery or bloody mucus discharge from the vagina. Water breaking (ruptured amniotic sac). How is this diagnosed? This condition is diagnosed based on: Your medical history and a physical exam. A pelvic exam. An ultrasound. Monitoring your uterus for contractions. Other tests, including: A swab of the cervix to check for a chemical called fetal fibronectin. Urine tests. How is this treated? Treatment for this condition depends on the length of your pregnancy, your condition, and the health of your baby. Treatment may include: Taking medicines, such as: Hormone medicines. These may be given early in pregnancy to help support the pregnancy. Medicines to stop contractions. Medicines to  help mature the baby's lungs. These may be prescribed if the risk of  delivery is high. Medicines to help protect your baby from brain and nerve complications such as cerebral palsy. Bed rest. If the labor happens before 34 weeks of pregnancy, you may need to stay in the hospital. Delivery of the baby. Follow these instructions at home:  Do not use any products that contain nicotine or tobacco. These products include cigarettes, chewing tobacco, and vaping devices, such as e-cigarettes. If you need help quitting, ask your health care provider. Do not drink alcohol. Take over-the-counter and prescription medicines only as told by your health care provider. Rest as told by your health care provider. Return to your normal activities as told by your health care provider. Ask your health care provider what activities are safe for you. Keep all follow-up visits. This is important. How is this prevented? To increase your chance of having a full-term pregnancy: Do not use drugs or take medicines that have not been prescribed to you during your pregnancy. Talk with your health care provider before taking any herbal supplements, even if you have been taking them regularly. Make sure you gain a healthy amount of weight during your pregnancy. Watch for infection. If you think that you might have an infection, get it checked right away. Symptoms of infection may include: Fever. Abnormal vaginal discharge or discharge that smells bad. Pain or burning with urination. Needing to urinate urgently. Frequently urinating or passing small amounts of urine frequently. Blood in your urine or urine that smells bad or unusual. Where to find more information U.S. Department of Health and Cytogeneticist on Women's Health: http://hoffman.com/ [employer]: www.acog.org Centers for Disease Control and Prevention, Preterm Birth: FootballExhibition.com.br Contact a health care provider if: You think you are going into preterm labor. You have signs  or symptoms of preterm labor. You have symptoms of infection. Get help right away if: You are having regular, painful contractions every 5 minutes or less. Your water breaks. Summary Preterm labor is labor that starts before you reach 37 weeks of pregnancy. Delivering your baby early increases your baby's risk of developing long-term problems. You are more likely to have preterm labor if you have certain risk factors that relate to your medical history, problems with present and past pregnancies, and lifestyle factors. Keep all follow-up visits. This is important. Contact a health care provider if you have signs or symptoms of preterm labor. This information is not intended to replace advice given to you by your health care provider. Make sure you discuss any questions you have with your healthcare provider. Document Revised: 04/17/2020 Document Reviewed: 04/17/2020 Elsevier Patient Education  2022 Elsevier Inc.       Round Ligament Pain  The round ligament is a cord of muscle and tissue that helps support the uterus. It can become a source of pain during pregnancy if it becomes stretched or twisted as the baby grows. The pain usually begins in the second trimester (13-28 weeks) of pregnancy, and it can come and go until the baby is delivered.It is not a serious problem, and it does not cause harm to the baby. Round ligament pain is usually a short, sharp, and pinching pain, but it can also be a dull, lingering, and aching pain. The pain is felt in the lower side of the abdomen or in the groin. It usually starts deep in the groin and moves up to the outside of the  hip area. The pain may occur when you: Suddenly change position, such as quickly going from a sitting to standing position. Roll over in bed. Cough or sneeze. Do physical activity. Follow these instructions at home:  Watch your condition for any changes. When the pain starts, relax. Then try any of these methods to help with  the pain: Sitting down. Flexing your knees up to your abdomen. Lying on your side with one pillow under your abdomen and another pillow between your legs. Sitting in a warm bath for 15-20 minutes or until the pain goes away. Take over-the-counter and prescription medicines only as told by your health care provider. Move slowly when you sit down or stand up. Avoid long walks if they cause pain. Stop or reduce your physical activities if they cause pain. Keep all follow-up visits as told by your health care provider. This is important. Contact a health care provider if: Your pain does not go away with treatment. You feel pain in your back that you did not have before. Your medicine is not helping. Get help right away if: You have a fever or chills. You develop uterine contractions. You have vaginal bleeding. You have nausea or vomiting. You have diarrhea. You have pain when you urinate. Summary Round ligament pain is felt in the lower abdomen or groin. It is usually a short, sharp, and pinching pain. It can also be a dull, lingering, and aching pain. This pain usually begins in the second trimester (13-28 weeks). It occurs because the uterus is stretching with the growing baby, and it is not harmful to the baby. You may notice the pain when you suddenly change position, when you cough or sneeze, or during physical activity. Relaxing, flexing your knees to your abdomen, lying on one side, or taking a warm bath may help to get rid of the pain. Get help from your health care provider if the pain does not go away or if you have vaginal bleeding, nausea, vomiting, diarrhea, or painful urination. This information is not intended to replace advice given to you by your health care provider. Make sure you discuss any questions you have with your healthcare provider. Document Revised: 03/30/2020 Document Reviewed: 09/30/2017 Elsevier Patient Education  2022 Elsevier Inc.       Oral Glucose  Tolerance Test During Pregnancy Why am I having this test? The oral glucose tolerance test (OGTT) is done to check how your body processes blood sugar (glucose). This is one of several tests used to diagnose diabetes that develops during pregnancy (gestational diabetes mellitus). Gestational diabetes is a short-term form of diabetes that some women develop while they are pregnant. It usually occurs during the second trimesterof pregnancy and goes away after delivery. Testing, or screening, for gestational diabetes usually occurs at weeks 24-28 of pregnancy. You may have the OGTT test after having a 1-hour glucose screening test if the results from that test indicate that you may have gestational diabetes. This test may also be needed if: You have a history of gestational diabetes. There is a history of giving birth to very large babies or of losing pregnancies (having stillbirths). You have signs and symptoms of diabetes, such as: Changes in your eyesight. Tingling or numbness in your hands or feet. Changes in hunger, thirst, and urination, and these are not explained by your pregnancy. What is being tested? This test measures the amount of glucose in your blood at different timesduring a period of 3 hours. This shows how well your  body can process glucose. What kind of sample is taken?  Blood samples are required for this test. They are usually collected byinserting a needle into a blood vessel. How do I prepare for this test? For 3 days before your test, eat normally. Have plenty of carbohydrate-rich foods. Follow instructions from your health care provider about: Eating or drinking restrictions on the day of the test. You may be asked not to eat or drink anything other than water (to fast) starting 8-10 hours before the test. Changing or stopping your regular medicines. Some medicines may interfere with this test. Tell a health care provider about: All medicines you are taking, including  vitamins, herbs, eye drops, creams, and over-the-counter medicines. Any blood disorders you have. Any surgeries you have had. Any medical conditions you have. What happens during the test? First, your blood glucose will be measured. This is referred to as your fasting blood glucose because you fasted before the test. Then, you will drink a glucose solution that contains a certain amount of glucose. Your blood glucosewill be measured again 1, 2, and 3 hours after you drink the solution. This test takes about 3 hours to complete. You will need to stay at the testing location during this time. During the testing period: Do not eat or drink anything other than the glucose solution. Do not exercise. Do not use any products that contain nicotine or tobacco, such as cigarettes, e-cigarettes, and chewing tobacco. These can affect your test results. If you need help quitting, ask your health care provider. The testing procedure may vary among health care providers and hospitals. How are the results reported? Your results will be reported as milligrams of glucose per deciliter of blood (mg/dL) or millimoles per liter (mmol/L). There is more than one source for screening and diagnosis reference values used to diagnose gestational diabetes. Your health care provider will compare your results to normal values that were established after testing a large group of people (reference values). Reference values may vary among labs and hospitals. For this test (Carpenter-Coustan), reference values are: Fasting: 95 mg/dL (5.3 mmol/L). 1 hour: 180 mg/dL (65.7 mmol/L). 2 hour: 155 mg/dL (8.6 mmol/L). 3 hour: 140 mg/dL (7.8 mmol/L). What do the results mean? Results below the reference values are considered normal. If two or more of your blood glucose levels are at or above the reference values, you may be diagnosed with gestational diabetes. If only one level is high, your healthcare provider may suggest repeat testing or  other tests to confirm a diagnosis. Talk with your health care provider about what your results mean. Questions to ask your health care provider Ask your health care provider, or the department that is doing the test: When will my results be ready? How will I get my results? What are my treatment options? What other tests do I need? What are my next steps? Summary The oral glucose tolerance test (OGTT) is one of several tests used to diagnose diabetes that develops during pregnancy (gestational diabetes mellitus). Gestational diabetes is a short-term form of diabetes that some women develop while they are pregnant. You may have the OGTT test after having a 1-hour glucose screening test if the results from that test show that you may have gestational diabetes. You may also have this test if you have any symptoms or risk factors for this type of diabetes. Talk with your health care provider about what your results mean. This information is not intended to replace advice given to you  by your health care provider. Make sure you discuss any questions you have with your healthcare provider. Document Revised: 09/22/2019 Document Reviewed: 09/22/2019 Elsevier Patient Education  2022 Elsevier Inc.       Pregnancy and Anemia  Anemia is a condition in which there is not enough red blood cells or hemoglobin in the blood. Hemoglobin is a substance in red blood cells that carries oxygen. When you do not have enough red blood cells or hemoglobin (are anemic), your body cannot get enough oxygen and your organs may not work properly. Anemia is common during pregnancy because your body needs more blood volume andblood cells to provide nutrition to the unborn baby. What are the causes? The most common cause of anemia during pregnancy is not having enough iron in the body to make red blood cells (iron deficiency anemia). Other causes may include: Folic acid deficiency. Vitamin B12 deficiency. Certain  prescription or over-the-counter medicines. Certain medical conditions or infections that destroy red blood cells. A low platelet count and bleeding caused by antibodies that go through the placenta to the baby from the mother's blood. What are the signs or symptoms? Mild anemia may not cause any symptoms. If anemia becomes severe, symptoms may include: Feeling tired or weak. Shortness of breath, especially during activity. Fainting. Pale skin. Headaches. A fast or irregular heartbeat. Dizziness. How is this diagnosed? This condition may be diagnosed based on your medical history and a physicalexam. You may also have blood tests. How is this treated? Treatment for anemia during pregnancy depends on the cause of the anemia. Treatment may include: Making changes to your diet. Taking iron, vitamin B12, or folic acid supplements. Having a blood transfusion. This may be needed if the anemia is severe. Follow these instructions at home: Eating and drinking Follow recommendations from your health care provider about changing your diet. Eat a diet rich in iron. This would include foods such as: Liver. Beef. Eggs. Whole grains. Spinach. Dried fruit. Increase your vitamin C intake. This will help the stomach absorb more iron. Some foods that are high in vitamin C include: Oranges. Peppers. Tomatoes. Mangoes. Eat green leafy vegetables. These are a good source of folic acid. General instructions Take iron supplements and vitamins as told by your health care provider. Keep all follow-up visits. This is important. Contact a health care provider if: You have headaches that happen often or do not go away. You bruise easily. You have a fever. You have nausea and vomiting for more than 24 hours. You are unable to take supplements prescribed to treat your anemia. Get help right away if: You develop signs or symptoms of severe anemia. You have bleeding from your vagina. You develop a  rash. You have bloody or tarry stools. You are very dizzy or you faint. Summary Anemia is a condition in which there is not enough red blood cells or hemoglobin in the blood. The most common cause of anemia during pregnancy is not having enough iron in the body to make red blood cells (iron deficiency anemia). Mild anemia may not cause any symptoms. If it becomes severe, symptoms may include feeling tired and weak. Take iron supplements and vitamins as told by your health care provider. Keep all follow-up visits. This is important. This information is not intended to replace advice given to you by your health care provider. Make sure you discuss any questions you have with your healthcare provider. Document Revised: 08/16/2019 Document Reviewed: 08/16/2019 Elsevier Patient Education  2022 [employer].

## 2020-12-19 NOTE — Progress Notes (Signed)
Subjective:  Cheryl Kline is a 20 y.o. G1P0 at [redacted]w[redacted]d being seen today for ongoing prenatal care.  She is currently monitored for the following issues for this low-risk pregnancy and has Encounter for supervision of normal first pregnancy in first trimester; Asthma; Chlamydia infection affecting pregnancy; UTI in pregnancy; and Anemia in pregnancy on their problem list.  Patient reports no complaints.  Contractions: Not present. Vag. Bleeding: None.  Movement: Present. Denies leaking of fluid.   The following portions of the patient's history were reviewed and updated as appropriate: allergies, current medications, past family history, past medical history, past social history, past surgical history and problem list. Problem list updated.  Objective:   Vitals:   12/19/20 1548  BP: 112/73  Pulse: 80  Weight: 140 lb (63.5 kg)    Fetal Status: Fetal Heart Rate (bpm): 146   Movement: Present     General:  Alert, oriented and cooperative. Patient is in no acute distress.  Skin: Skin is warm and dry. No rash noted.   Cardiovascular: Normal heart rate noted  Respiratory: Normal respiratory effort, no problems with respiration noted  Abdomen: Soft, gravid, appropriate for gestational age. Pain/Pressure: Absent     Pelvic: Vag. Bleeding: None     Cervical exam deferred        Extremities: Normal range of motion.  Edema: None  Mental Status: Normal mood and affect. Normal behavior. Normal judgment and thought content.   Urinalysis:      Assessment and Plan:  Pregnancy: G1P0 at [redacted]w[redacted]d  1. Encounter for supervision of normal first pregnancy in first trimester -GTT/labs next visit  2. [redacted] weeks gestation of pregnancy  3. Chlamydia infection affecting pregnancy in second trimester - pt not previously treated, will treat today, TOC next visit -advised partner to be tested and treated -pt advised to take medication ASAP and refrain from intercourse from now until 7 days after partner is  treated  4. Urinary tract infection in mother during second trimester of pregnancy - TOC today - Urine Culture  5. Anemia during pregnancy in second trimester -pt states was not given oral iron RX at pharmacy, resending today  Preterm labor symptoms and general obstetric precautions including but not limited to vaginal bleeding, contractions, leaking of fluid and fetal movement were reviewed in detail with the patient. I discussed the assessment and treatment plan with the patient. The patient was provided an opportunity to ask questions and all were answered. The patient agreed with the plan and demonstrated an understanding of the instructions. The patient was advised to call back or seek an in-person office evaluation/go to [facility] at [facility] for any urgent or concerning symptoms. Please refer to After Visit Summary for other counseling recommendations.  Return in about 4 weeks (around 01/16/2021) for in-person LOB/APP OK/GTT/labs.   Kanan Sobek, Odie Sera, NP

## 2020-12-19 NOTE — Addendum Note (Signed)
Addended by: Natale Milch D on: 12/19/2020 04:53 PM   Modules accepted: Orders

## 2020-12-22 LAB — URINE CULTURE, OB REFLEX

## 2020-12-22 LAB — CULTURE, OB URINE

## 2020-12-25 ENCOUNTER — Other Ambulatory Visit: Payer: Self-pay | Admitting: Women's Health

## 2020-12-25 ENCOUNTER — Encounter: Payer: Self-pay | Admitting: Women's Health

## 2020-12-25 DIAGNOSIS — R8271 Bacteriuria: Secondary | ICD-10-CM

## 2020-12-25 DIAGNOSIS — Z9049 Acquired absence of other specified parts of digestive tract: Secondary | ICD-10-CM | POA: Insufficient documentation

## 2020-12-25 DIAGNOSIS — O2342 Unspecified infection of urinary tract in pregnancy, second trimester: Secondary | ICD-10-CM

## 2020-12-25 HISTORY — DX: Bacteriuria: R82.71

## 2020-12-25 MED ORDER — AMOXICILLIN 500 MG PO CAPS: 500.0000 mg | ORAL_CAPSULE | Freq: Three times a day (TID) | ORAL | 0 refills | Status: AC

## 2020-12-25 NOTE — Progress Notes (Signed)
RX amoxicillin.  Marylen Ponto, NP  10:55 AM 12/25/2020

## 2021-01-18 ENCOUNTER — Encounter: Payer: Self-pay | Admitting: Obstetrics and Gynecology

## 2021-01-18 ENCOUNTER — Other Ambulatory Visit (HOSPITAL_COMMUNITY)
Admission: RE | Admit: 2021-01-18 | Discharge: 2021-01-18 | Disposition: A | Discharge status: Home / Self Care | Payer: Medicaid Other | Source: Ambulatory Visit | Attending: Obstetrics and Gynecology | Admitting: Obstetrics and Gynecology

## 2021-01-18 ENCOUNTER — Ambulatory Visit (INDEPENDENT_AMBULATORY_CARE_PROVIDER_SITE_OTHER): Payer: Medicaid Other | Admitting: Obstetrics and Gynecology

## 2021-01-18 ENCOUNTER — Other Ambulatory Visit: Payer: Medicaid Other

## 2021-01-18 ENCOUNTER — Other Ambulatory Visit: Payer: Self-pay

## 2021-01-18 VITALS — BP 110/73 | HR 71 | Wt 146.0 lb

## 2021-01-18 DIAGNOSIS — Z3402 Encounter for supervision of normal first pregnancy, second trimester: Secondary | ICD-10-CM | POA: Diagnosis present

## 2021-01-18 DIAGNOSIS — Z3401 Encounter for supervision of normal first pregnancy, first trimester: Secondary | ICD-10-CM

## 2021-01-18 DIAGNOSIS — O24410 Gestational diabetes mellitus in pregnancy, diet controlled: Secondary | ICD-10-CM

## 2021-01-18 DIAGNOSIS — A749 Chlamydial infection, unspecified: Secondary | ICD-10-CM

## 2021-01-18 DIAGNOSIS — O99013 Anemia complicating pregnancy, third trimester: Secondary | ICD-10-CM

## 2021-01-18 DIAGNOSIS — R8271 Bacteriuria: Secondary | ICD-10-CM

## 2021-01-18 DIAGNOSIS — O98813 Other maternal infectious and parasitic diseases complicating pregnancy, third trimester: Secondary | ICD-10-CM

## 2021-01-18 NOTE — Progress Notes (Signed)
   PRENATAL VISIT NOTE  Subjective:  Cheryl Kline is a 20 y.o. G1P0 at [redacted]w[redacted]d being seen today for ongoing prenatal care.  She is currently monitored for the following issues for this low-risk pregnancy and has Encounter for supervision of normal first pregnancy in first trimester; Asthma; Chlamydia infection affecting pregnancy; UTI in pregnancy; Anemia in pregnancy; History of appendectomy; and GBS bacteriuria on their problem list.  Patient reports no complaints.  Contractions: Not present. Vag. Bleeding: None.  Movement: Present. Denies leaking of fluid.   The following portions of the patient's history were reviewed and updated as appropriate: allergies, current medications, past family history, past medical history, past social history, past surgical history and problem list.   Objective:   Vitals:   01/18/21 0830  BP: 110/73  Pulse: 71  Weight: 146 lb (66.2 kg)    Fetal Status: Fetal Heart Rate (bpm): 152 Fundal Height: 29 cm Movement: Present     General:  Alert, oriented and cooperative. Patient is in no acute distress.  Skin: Skin is warm and dry. No rash noted.   Cardiovascular: Normal heart rate noted  Respiratory: Normal respiratory effort, no problems with respiration noted  Abdomen: Soft, gravid, appropriate for gestational age.  Pain/Pressure: Absent     Pelvic: Cervical exam deferred        Extremities: Normal range of motion.  Edema: None  Mental Status: Normal mood and affect. Normal behavior. Normal judgment and thought content.   Assessment and Plan:  Pregnancy: G1P0 at [redacted]w[redacted]d 1. Encounter for supervision of normal first pregnancy in second trimester Patient is doing well without complaints Third trimester labs today Patient is considering pills for contraception Patient is still researching pediatrians - Glucose Tolerance, 2 Hours w/1 Hour - RPR - CBC - HIV Antibody (routine testing w rflx) - Cervicovaginal ancillary only( [place])  2. Encounter for  supervision of normal first pregnancy in first trimester   3. Anemia during pregnancy in third trimester   4. Chlamydia infection affecting pregnancy in third trimester TOC today  5. GBS bacteriuria Prophylaxis in labor  Preterm labor symptoms and general obstetric precautions including but not limited to vaginal bleeding, contractions, leaking of fluid and fetal movement were reviewed in detail with the patient. Please refer to After Visit Summary for other counseling recommendations.   Return in about 2 weeks (around 02/01/2021) for in person, ROB, Low risk.  No future appointments.  Catalina Antigua, MD

## 2021-01-18 NOTE — Progress Notes (Signed)
Pt reports fetal movement, denies pain.  

## 2021-01-18 NOTE — Progress Notes (Signed)
2 hour gtt

## 2021-01-19 LAB — CBC
Hematocrit: 29.5 % — ABNORMAL LOW (ref 34.0–46.6)
Hemoglobin: 9.5 g/dL — ABNORMAL LOW (ref 11.1–15.9)
MCH: 27.4 pg (ref 26.6–33.0)
MCHC: 32.2 g/dL (ref 31.5–35.7)
MCV: 85 fL (ref 79–97)
Platelets: 365 10*3/uL (ref 150–450)
RBC: 3.47 x10E6/uL — ABNORMAL LOW (ref 3.77–5.28)
RDW: 13.1 % (ref 11.7–15.4)
WBC: 6.4 10*3/uL (ref 3.4–10.8)

## 2021-01-19 LAB — GLUCOSE TOLERANCE, 2 HOURS W/ 1HR
Glucose, 1 hour: 92 mg/dL (ref 65–179)
Glucose, 2 hour: 89 mg/dL (ref 65–152)
Glucose, Fasting: 93 mg/dL — ABNORMAL HIGH (ref 65–91)

## 2021-01-19 LAB — HIV ANTIBODY (ROUTINE TESTING W REFLEX): HIV Screen 4th Generation wRfx: NONREACTIVE

## 2021-01-19 LAB — RPR: RPR Ser Ql: NONREACTIVE

## 2021-01-21 ENCOUNTER — Other Ambulatory Visit: Payer: Self-pay | Admitting: *Deleted

## 2021-01-21 ENCOUNTER — Encounter: Payer: Self-pay | Admitting: *Deleted

## 2021-01-21 DIAGNOSIS — O24419 Gestational diabetes mellitus in pregnancy, unspecified control: Secondary | ICD-10-CM | POA: Insufficient documentation

## 2021-01-21 DIAGNOSIS — O24410 Gestational diabetes mellitus in pregnancy, diet controlled: Secondary | ICD-10-CM

## 2021-01-21 HISTORY — DX: Gestational diabetes mellitus in pregnancy, unspecified control: O24.419

## 2021-01-21 LAB — CERVICOVAGINAL ANCILLARY ONLY
Chlamydia: POSITIVE — AB
Comment: NEGATIVE
Comment: NORMAL
Neisseria Gonorrhea: NEGATIVE

## 2021-01-21 MED ORDER — ACCU-CHEK SOFTCLIX LANCETS MISC: 1.0000 | Freq: Four times a day (QID) | 12 refills | Status: DC

## 2021-01-21 MED ORDER — ACCU-CHEK GUIDE W/DEVICE KIT: 1.0000 | PACK | Freq: Four times a day (QID) | 0 refills | Status: AC

## 2021-01-21 MED ORDER — ACCU-CHEK GUIDE VI STRP: ORAL_STRIP | 12 refills | Status: DC

## 2021-01-21 NOTE — Addendum Note (Signed)
Addended by: Catalina Antigua on: 01/21/2021 08:46 AM   Modules accepted: Orders, SmartSet

## 2021-01-22 ENCOUNTER — Other Ambulatory Visit: Payer: Self-pay | Admitting: Obstetrics and Gynecology

## 2021-01-22 ENCOUNTER — Telehealth: Payer: Self-pay | Admitting: *Deleted

## 2021-01-22 ENCOUNTER — Encounter: Payer: Self-pay | Admitting: *Deleted

## 2021-01-22 MED ORDER — AZITHROMYCIN 500 MG PO TABS: 1000.0000 mg | ORAL_TABLET | Freq: Once | ORAL | 1 refills | Status: AC

## 2021-01-22 NOTE — Progress Notes (Signed)
Disregard 8:58 am message. Entered in error.

## 2021-01-22 NOTE — Addendum Note (Signed)
Addended by: Catalina Antigua on: 01/22/2021 08:35 AM   Modules accepted: Orders

## 2021-01-22 NOTE — Progress Notes (Signed)
Appointment for 2:00 pm 01/22/21.

## 2021-01-22 NOTE — Progress Notes (Signed)
TC to patient. Informed of iron infusion appt 01/30/21 at 8 am. Given directions.

## 2021-01-22 NOTE — Telephone Encounter (Signed)
TC to patient to notify of iron infusion scheduled for Wed 01/30/21 at [facility]. Instructed to go in the main entrance at the [facility] and check in at the visitor's desk.

## 2021-01-22 NOTE — Progress Notes (Signed)
TC to patient to inform of positive chlamydia results and RX sent to pharmacy. Informed of need for partner to be treated as well. States partner was not treated after last positive result. Informed that she and patient must abstain from sex until 7 days after treatment. Informed that she will need TOC in 4 weeks. Went over GTT results and anemia as well. Informed that she will see diabetes educator, has appt. Informed that we will call her about iron infusion appt. Messages with additional education on all topics have been sent via MyChart. Pt informed.

## 2021-01-30 ENCOUNTER — Ambulatory Visit (HOSPITAL_COMMUNITY)
Admission: RE | Admit: 2021-01-30 | Discharge: 2021-01-30 | Disposition: A | Discharge status: Home / Self Care | Payer: Medicaid Other | Source: Ambulatory Visit | Attending: Obstetrics and Gynecology | Admitting: Obstetrics and Gynecology

## 2021-01-30 ENCOUNTER — Other Ambulatory Visit: Payer: Self-pay

## 2021-01-30 DIAGNOSIS — O99013 Anemia complicating pregnancy, third trimester: Secondary | ICD-10-CM | POA: Insufficient documentation

## 2021-01-30 MED ORDER — IRON SUCROSE 20 MG/ML IV SOLN
500.0000 mg | Freq: Once | INTRAVENOUS | Status: AC
  Administered 2021-01-30: 500 mg via INTRAVENOUS
  Filled 2021-01-30: qty 25

## 2021-02-01 ENCOUNTER — Other Ambulatory Visit: Payer: Self-pay

## 2021-02-01 ENCOUNTER — Ambulatory Visit (INDEPENDENT_AMBULATORY_CARE_PROVIDER_SITE_OTHER): Payer: Medicaid Other | Admitting: Obstetrics

## 2021-02-01 ENCOUNTER — Encounter: Payer: Self-pay | Admitting: Obstetrics

## 2021-02-01 VITALS — BP 114/69 | HR 82 | Wt 148.0 lb

## 2021-02-01 DIAGNOSIS — Z23 Encounter for immunization: Secondary | ICD-10-CM | POA: Diagnosis not present

## 2021-02-01 DIAGNOSIS — A749 Chlamydial infection, unspecified: Secondary | ICD-10-CM

## 2021-02-01 DIAGNOSIS — O26843 Uterine size-date discrepancy, third trimester: Secondary | ICD-10-CM

## 2021-02-01 DIAGNOSIS — R8271 Bacteriuria: Secondary | ICD-10-CM

## 2021-02-01 DIAGNOSIS — O24410 Gestational diabetes mellitus in pregnancy, diet controlled: Secondary | ICD-10-CM

## 2021-02-01 DIAGNOSIS — O2342 Unspecified infection of urinary tract in pregnancy, second trimester: Secondary | ICD-10-CM

## 2021-02-01 DIAGNOSIS — O0990 Supervision of high risk pregnancy, unspecified, unspecified trimester: Secondary | ICD-10-CM

## 2021-02-01 DIAGNOSIS — O98813 Other maternal infectious and parasitic diseases complicating pregnancy, third trimester: Secondary | ICD-10-CM

## 2021-02-01 DIAGNOSIS — O99013 Anemia complicating pregnancy, third trimester: Secondary | ICD-10-CM

## 2021-02-01 MED ORDER — VITAFOL ULTRA 29-0.6-0.4-200 MG PO CAPS: 1.0000 | ORAL_CAPSULE | Freq: Every day | ORAL | 4 refills | Status: AC

## 2021-02-01 NOTE — Progress Notes (Signed)
Patient presents for ROB. Patient has no concerns today. TDAP and Flu vaccine given today.

## 2021-02-01 NOTE — Progress Notes (Signed)
Subjective:  Cheryl Kline is a 20 y.o. G1P0 at [redacted]w[redacted]d being seen today for ongoing prenatal care.  She is currently monitored for the following issues for this high-risk pregnancy and has Encounter for supervision of normal first pregnancy in first trimester; Asthma; Chlamydia infection affecting pregnancy; UTI in pregnancy; Anemia in pregnancy; History of appendectomy; GBS bacteriuria; and Gestational diabetes on their problem list.  Patient reports no complaints.  Contractions: Not present. Vag. Bleeding: None.  Movement: Present. Denies leaking of fluid.   The following portions of the patient's history were reviewed and updated as appropriate: allergies, current medications, past family history, past medical history, past social history, past surgical history and problem list. Problem list updated.  Objective:   Vitals:   02/01/21 1122  BP: 114/69  Pulse: 82  Weight: 148 lb (67.1 kg)    Fetal Status:     Movement: Present     General:  Alert, oriented and cooperative. Patient is in no acute distress.  Skin: Skin is warm and dry. No rash noted.   Cardiovascular: Normal heart rate noted  Respiratory: Normal respiratory effort, no problems with respiration noted  Abdomen: Soft, gravid, appropriate for gestational age. Pain/Pressure: Absent     Pelvic:  Cervical exam deferred        Extremities: Normal range of motion.  Edema: None  Mental Status: Normal mood and affect. Normal behavior. Normal judgment and thought content.   Urinalysis:      Assessment and Plan:  Pregnancy: G1P0 at [redacted]w[redacted]d  1. Supervision of high risk pregnancy, antepartum  2. Diet controlled gestational diabetes mellitus (GDM) in third trimester - has not received Diabetic Teaching yet - has not started glucose testing yet  3. GBS bacteriuria, treated - treat in labor  4. Chlamydia infection affecting pregnancy in third trimester, treated - TOC at 36 weeks  5. Urinary tract infection in mother during second  trimester of pregnancy, treated  6. Anemia during pregnancy in third trimester - taking iron   Preterm labor symptoms and general obstetric precautions including but not limited to vaginal bleeding, contractions, leaking of fluid and fetal movement were reviewed in detail with the patient. Please refer to After Visit Summary for other counseling recommendations.   Return in about 2 weeks (around 02/15/2021) for ROB.   Brock Bad, MD  02/01/21

## 2021-02-05 ENCOUNTER — Ambulatory Visit: Payer: Medicaid Other

## 2021-02-06 ENCOUNTER — Ambulatory Visit: Payer: Medicaid Other | Admitting: *Deleted

## 2021-02-06 ENCOUNTER — Other Ambulatory Visit: Payer: Self-pay | Admitting: *Deleted

## 2021-02-06 ENCOUNTER — Other Ambulatory Visit: Payer: Self-pay

## 2021-02-06 ENCOUNTER — Ambulatory Visit (HOSPITAL_BASED_OUTPATIENT_CLINIC_OR_DEPARTMENT_OTHER): Payer: Medicaid Other | Admitting: Obstetrics and Gynecology

## 2021-02-06 ENCOUNTER — Encounter: Payer: Medicaid Other | Attending: Obstetrics and Gynecology | Admitting: Registered"

## 2021-02-06 ENCOUNTER — Ambulatory Visit: Payer: Medicaid Other | Attending: Obstetrics

## 2021-02-06 ENCOUNTER — Ambulatory Visit: Payer: Medicaid Other | Admitting: Registered"

## 2021-02-06 VITALS — BP 101/57 | HR 64

## 2021-02-06 DIAGNOSIS — Z3A30 30 weeks gestation of pregnancy: Secondary | ICD-10-CM

## 2021-02-06 DIAGNOSIS — O98813 Other maternal infectious and parasitic diseases complicating pregnancy, third trimester: Secondary | ICD-10-CM | POA: Insufficient documentation

## 2021-02-06 DIAGNOSIS — O0933 Supervision of pregnancy with insufficient antenatal care, third trimester: Secondary | ICD-10-CM | POA: Insufficient documentation

## 2021-02-06 DIAGNOSIS — A749 Chlamydial infection, unspecified: Secondary | ICD-10-CM

## 2021-02-06 DIAGNOSIS — Z3689 Encounter for other specified antenatal screening: Secondary | ICD-10-CM | POA: Diagnosis not present

## 2021-02-06 DIAGNOSIS — O24419 Gestational diabetes mellitus in pregnancy, unspecified control: Secondary | ICD-10-CM | POA: Diagnosis not present

## 2021-02-06 DIAGNOSIS — O24410 Gestational diabetes mellitus in pregnancy, diet controlled: Secondary | ICD-10-CM

## 2021-02-06 DIAGNOSIS — R8271 Bacteriuria: Secondary | ICD-10-CM

## 2021-02-06 DIAGNOSIS — O26843 Uterine size-date discrepancy, third trimester: Secondary | ICD-10-CM | POA: Diagnosis not present

## 2021-02-06 DIAGNOSIS — Z3A00 Weeks of gestation of pregnancy not specified: Secondary | ICD-10-CM | POA: Diagnosis not present

## 2021-02-06 NOTE — Progress Notes (Signed)
Patient was seen for Gestational Diabetes self-management on 02/06/21  Start time 1020 and End time 1120   Estimated due date: 04/12/2021; [redacted]w[redacted]d  Clinical: Medications: prenatal vitamin, iron Medical History: anemia in pregnancy, UTI in pregnancy Labs: OGTT 93-92-89, A1c n/a%   Dietary and Lifestyle History:   Physical Activity: Stress: Sleep:  24 hr Recall: First Meal: Snack: Second meal: Snack: Third meal: Snack: Beverages:  NUTRITION INTERVENTION  Nutrition education (E-1) on the following topics:   Initial Follow-up  [x]  []  Definition of Gestational Diabetes [x]  []  Why dietary management is important in controlling blood glucose [x]  []  Effects each nutrient has on blood glucose levels []  []  Simple carbohydrates vs complex carbohydrates [x]  []  Fluid intake [x]  []  Creating a balanced meal plan [x]  []  Carbohydrate counting  [x]  []  When to check blood glucose levels [x]  []  Proper blood glucose monitoring techniques [x]  []  Effect of stress and stress reduction techniques  [x]  []  Exercise effect on blood glucose levels, appropriate exercise during pregnancy [x]  []  Importance of limiting caffeine and abstaining from alcohol and smoking [x]  []  Medications used for blood sugar control during pregnancy [x]  []  Hypoglycemia and rule of 15 [x]  []  Postpartum self care  Patient already has a meter, but is not testing pre breakfast and 2 hours after each meal.  Patient instructed to monitor glucose levels: FBS: 60 - ? 95 mg/dL (some clinics use 90 for cutoff) 1 hour: ? 140 mg/dL 2 hour: ? mg/dL  Patient received handouts: Nutrition Diabetes and Pregnancy Carbohydrate Counting List  Patient will be seen for follow-up as needed.

## 2021-02-06 NOTE — Progress Notes (Signed)
Maternal-Fetal Medicine   Name: Cheryl Kline DOB: 04/12/2001 MRN: [id number] Referring Provider: Baltazar Najjar, MD   I had the pleasure of [facility] at [facility]. She is G1 P0 at [place] gestation and is here for ultrasound evaluation.  Patient had inadequate prenatal care.  Her EDD was established by early ultrasound performed elsewhere.  She has a recent diagnosis of gestational diabetes. Patient just met with our diabetic educators and has not started checking her blood glucose yet. Her blood pressures have been normal at prenatal visits.  Ultrasound Amniotic fluid is normal good fetal activity seen.  Fetal growth is appropriate for gestational age.  Fetal anatomical survey appears normal but limited by advanced gestational age.  Blood pressure today at her office is 100 oh 1/57 mmHg. Our concerns include: Gestational diabetes I explained the diagnosis of gestational diabetes.  I emphasized the importance of good blood glucose control to prevent adverse fetal or neonatal outcomes.  I discussed blood glucose parameters of both fasting and postprandial levels.  I encouraged her to check her blood glucose regularly. Possible complications of gestational diabetes include fetal macrosomia, shoulder dystocia and birth injuries, stillbirth (in poorly controlled diabetes) and neonatal respiratory syndrome and other complications.  Most gestational diabetes are controlled by diet alone (85%).  Exercise reduces the need for insulin.  Medical treatment includes oral hypoglycemics or insulin.  Timing of delivery: In well-controlled diabetes on diet, patient can be delivered at 39 or [redacted] weeks gestation.  Recommendations -An appointment was made for her to return in 4 weeks for fetal growth assessment. -Weekly BPP from next visit till delivery if patient is on oral hypoglycemics or insulin. Thank you for consultation.  If you have any questions or concerns, please  contact me the [facility].  Consultation including face-to-face (more than 50%) counseling 30 minutes.

## 2021-02-11 ENCOUNTER — Other Ambulatory Visit: Payer: Self-pay

## 2021-02-11 ENCOUNTER — Ambulatory Visit (INDEPENDENT_AMBULATORY_CARE_PROVIDER_SITE_OTHER): Payer: Medicaid Other | Admitting: Advanced Practice Midwife

## 2021-02-11 ENCOUNTER — Other Ambulatory Visit (HOSPITAL_COMMUNITY)
Admission: RE | Admit: 2021-02-11 | Discharge: 2021-02-11 | Disposition: A | Discharge status: Home / Self Care | Payer: Medicaid Other | Source: Ambulatory Visit | Attending: Advanced Practice Midwife | Admitting: Advanced Practice Midwife

## 2021-02-11 VITALS — Wt 145.2 lb

## 2021-02-11 DIAGNOSIS — O24410 Gestational diabetes mellitus in pregnancy, diet controlled: Secondary | ICD-10-CM

## 2021-02-11 DIAGNOSIS — O0990 Supervision of high risk pregnancy, unspecified, unspecified trimester: Secondary | ICD-10-CM

## 2021-02-11 DIAGNOSIS — O98813 Other maternal infectious and parasitic diseases complicating pregnancy, third trimester: Secondary | ICD-10-CM

## 2021-02-11 DIAGNOSIS — Z3A31 31 weeks gestation of pregnancy: Secondary | ICD-10-CM

## 2021-02-11 DIAGNOSIS — O99012 Anemia complicating pregnancy, second trimester: Secondary | ICD-10-CM

## 2021-02-11 DIAGNOSIS — A749 Chlamydial infection, unspecified: Secondary | ICD-10-CM

## 2021-02-11 DIAGNOSIS — O2340 Unspecified infection of urinary tract in pregnancy, unspecified trimester: Secondary | ICD-10-CM

## 2021-02-11 DIAGNOSIS — Z3480 Encounter for supervision of other normal pregnancy, unspecified trimester: Secondary | ICD-10-CM | POA: Diagnosis not present

## 2021-02-11 DIAGNOSIS — R8271 Bacteriuria: Secondary | ICD-10-CM

## 2021-02-11 NOTE — Progress Notes (Signed)
   PRENATAL VISIT NOTE  Subjective:  Cheryl Kline is a 20 y.o. G1P0 at [redacted]w[redacted]d being seen today for ongoing prenatal care.  She is currently monitored for the following issues for this low-risk pregnancy and has Encounter for supervision of normal first pregnancy in first trimester; Asthma; Chlamydia infection affecting pregnancy; UTI in pregnancy; Anemia in pregnancy; History of appendectomy; GBS bacteriuria; and Gestational diabetes on their problem list.  Patient reports no complaints.  Contractions: Irritability. Vag. Bleeding: None.  Movement: Present. Denies leaking of fluid.   The following portions of the patient's history were reviewed and updated as appropriate: allergies, current medications, past family history, past medical history, past social history, past surgical history and problem list.   Objective:   Vitals:   02/11/21 1333  Weight: 145 lb 3.2 oz (65.9 kg)    Fetal Status: Fetal Heart Rate (bpm): 153 Fundal Height: 31 cm Movement: Present     General:  Alert, oriented and cooperative. Patient is in no acute distress.  Skin: Skin is warm and dry. No rash noted.   Cardiovascular: Normal heart rate noted  Respiratory: Normal respiratory effort, no problems with respiration noted  Abdomen: Soft, gravid, appropriate for gestational age.  Pain/Pressure: Absent     Pelvic: Cervical exam deferred        Extremities: Normal range of motion.  Edema: None  Mental Status: Normal mood and affect. Normal behavior. Normal judgment and thought content.   Assessment and Plan:  Pregnancy: G1P0 at [redacted]w[redacted]d  1. Supervision of high risk pregnancy, antepartum --Anticipatory guidance about next visits/weeks of pregnancy given. --Next visit in 2 weeks  2. Diet controlled gestational diabetes mellitus (GDM) in third trimester --Reviewed log, 6 fastings all within range, 12 postprandial values, with 2 in 120s. Pt has log of foods that led to elevated values.  Encouraged dietary  changes. --Continue diet control  3. [redacted] weeks gestation of pregnancy   4. Chlamydia infection affecting pregnancy in third trimester --TOC today - Cervicovaginal ancillary only( [place])  5. GBS bacteriuria --Prophylaxis in labor  6. Urinary tract infection in mother during pregnancy, antepartum   7. Anemia during pregnancy in second trimester   Preterm labor symptoms and general obstetric precautions including but not limited to vaginal bleeding, contractions, leaking of fluid and fetal movement were reviewed in detail with the patient. Please refer to After Visit Summary for other counseling recommendations.   Return in about 2 weeks (around 02/25/2021).  Future Appointments  Date Time Provider Department Center  02/25/2021  1:30 PM Brand Males, CNM [facility] None  03/06/2021 12:30 PM [facility] NURSE [facility]  03/06/2021 12:45 PM [facility] US5 [facility]    Sharen Counter, CNM'

## 2021-02-11 NOTE — Progress Notes (Signed)
ROB 31.3 wks No complaints Has picture of CBG log in phone  +Chlamydia 3 wks ago

## 2021-02-12 LAB — CERVICOVAGINAL ANCILLARY ONLY
Chlamydia: NEGATIVE
Comment: NEGATIVE
Comment: NEGATIVE
Comment: NORMAL
Neisseria Gonorrhea: NEGATIVE
Trichomonas: NEGATIVE

## 2021-02-25 ENCOUNTER — Ambulatory Visit (INDEPENDENT_AMBULATORY_CARE_PROVIDER_SITE_OTHER): Payer: Medicaid Other

## 2021-02-25 ENCOUNTER — Other Ambulatory Visit (HOSPITAL_COMMUNITY)
Admission: RE | Admit: 2021-02-25 | Discharge: 2021-02-25 | Disposition: A | Discharge status: Home / Self Care | Payer: Medicaid Other | Source: Ambulatory Visit

## 2021-02-25 ENCOUNTER — Other Ambulatory Visit: Payer: Self-pay

## 2021-02-25 VITALS — BP 109/74 | HR 99 | Wt 148.0 lb

## 2021-02-25 DIAGNOSIS — R8271 Bacteriuria: Secondary | ICD-10-CM

## 2021-02-25 DIAGNOSIS — O98813 Other maternal infectious and parasitic diseases complicating pregnancy, third trimester: Secondary | ICD-10-CM | POA: Insufficient documentation

## 2021-02-25 DIAGNOSIS — O99013 Anemia complicating pregnancy, third trimester: Secondary | ICD-10-CM

## 2021-02-25 DIAGNOSIS — A749 Chlamydial infection, unspecified: Secondary | ICD-10-CM | POA: Diagnosis present

## 2021-02-25 DIAGNOSIS — O0993 Supervision of high risk pregnancy, unspecified, third trimester: Secondary | ICD-10-CM

## 2021-02-25 DIAGNOSIS — O24410 Gestational diabetes mellitus in pregnancy, diet controlled: Secondary | ICD-10-CM

## 2021-02-25 DIAGNOSIS — Z3A33 33 weeks gestation of pregnancy: Secondary | ICD-10-CM

## 2021-02-25 NOTE — Progress Notes (Signed)
   PRENATAL VISIT NOTE  Subjective:  Cheryl Kline is a 20 y.o. G1P0 at [redacted]w[redacted]d being seen today for ongoing prenatal care.  She is currently monitored for the following issues for this high-risk pregnancy and has Encounter for supervision of normal first pregnancy in first trimester; Asthma; Chlamydia infection affecting pregnancy; UTI in pregnancy; Anemia in pregnancy; History of appendectomy; GBS bacteriuria; and Gestational diabetes on their problem list.  Patient reports no complaints.  Contractions: Not present. Vag. Bleeding: None.  Movement: Present. Denies leaking of fluid.   The following portions of the patient's history were reviewed and updated as appropriate: allergies, current medications, past family history, past medical history, past social history, past surgical history and problem list.   Objective:   Vitals:   02/25/21 1353  BP: 109/74  Pulse: 99  Weight: 148 lb (67.1 kg)    Fetal Status: Fetal Heart Rate (bpm): 158 Fundal Height: 33 cm Movement: Present     General:  Alert, oriented and cooperative. Patient is in no acute distress.  Skin: Skin is warm and dry. No rash noted.   Cardiovascular: Normal heart rate noted  Respiratory: Normal respiratory effort, no problems with respiration noted  Abdomen: Soft, gravid, appropriate for gestational age.  Pain/Pressure: Absent     Pelvic: Cervical exam deferred        Extremities: Normal range of motion.  Edema: None  Mental Status: Normal mood and affect. Normal behavior. Normal judgment and thought content.   Assessment and Plan:  Pregnancy: G1P0 at [redacted]w[redacted]d 1. Supervision of high risk pregnancy in third trimester - Routine OB care - No concerns - Anticipatory guidance for upcoming appointments provided  2. [redacted] weeks gestation of pregnancy - Endorses active fetal movement  3. Diet controlled gestational diabetes mellitus (GDM) in third trimester - BS log reviewed. All fasting wnl. 3 elevated PP between 125-130. Pt  logged foods associated with elevated levels. Encouraged dietary changes, including increased protein and limited simple carbohydrates. - Continue diet control  4. GBS bacteriuria - Prophylaxis in labor  5. Chlamydia infection affecting pregnancy in third trimester - Reports she and partner had IC but condom broke. Both she and partner were treated but still concerned about possible reexposure. Requesting test today  6. Anemia during pregnancy in third trimester   Preterm labor symptoms and general obstetric precautions including but not limited to vaginal bleeding, contractions, leaking of fluid and fetal movement were reviewed in detail with the patient. Please refer to After Visit Summary for other counseling recommendations.   Return in about 2 weeks (around 03/11/2021).  Future Appointments  Date Time Provider Department Center  03/06/2021 12:30 PM [facility] NURSE [facility]  03/06/2021 12:45 PM [facility] US5 [facility]  03/11/2021  1:50 PM Leftwich-Kirby, Wilmer Floor, CNM [facility] None     Brand Males, CNM 02/25/21 3:01 PM

## 2021-02-26 LAB — CERVICOVAGINAL ANCILLARY ONLY
Chlamydia: NEGATIVE
Comment: NEGATIVE
Comment: NORMAL
Neisseria Gonorrhea: NEGATIVE

## 2021-02-27 ENCOUNTER — Other Ambulatory Visit: Payer: Self-pay

## 2021-03-06 ENCOUNTER — Encounter: Payer: Self-pay | Admitting: *Deleted

## 2021-03-06 ENCOUNTER — Other Ambulatory Visit: Payer: Self-pay

## 2021-03-06 ENCOUNTER — Ambulatory Visit: Payer: Medicaid Other | Admitting: *Deleted

## 2021-03-06 ENCOUNTER — Ambulatory Visit: Payer: Medicaid Other | Attending: Obstetrics and Gynecology

## 2021-03-06 VITALS — BP 99/55 | HR 73

## 2021-03-06 DIAGNOSIS — O24410 Gestational diabetes mellitus in pregnancy, diet controlled: Secondary | ICD-10-CM | POA: Diagnosis present

## 2021-03-06 DIAGNOSIS — A749 Chlamydial infection, unspecified: Secondary | ICD-10-CM

## 2021-03-06 DIAGNOSIS — O2343 Unspecified infection of urinary tract in pregnancy, third trimester: Secondary | ICD-10-CM | POA: Diagnosis present

## 2021-03-06 DIAGNOSIS — R8271 Bacteriuria: Secondary | ICD-10-CM

## 2021-03-06 DIAGNOSIS — O24419 Gestational diabetes mellitus in pregnancy, unspecified control: Secondary | ICD-10-CM | POA: Insufficient documentation

## 2021-03-06 DIAGNOSIS — O0933 Supervision of pregnancy with insufficient antenatal care, third trimester: Secondary | ICD-10-CM

## 2021-03-06 DIAGNOSIS — O99013 Anemia complicating pregnancy, third trimester: Secondary | ICD-10-CM | POA: Diagnosis present

## 2021-03-06 DIAGNOSIS — O98813 Other maternal infectious and parasitic diseases complicating pregnancy, third trimester: Secondary | ICD-10-CM | POA: Diagnosis present

## 2021-03-06 DIAGNOSIS — Z3A34 34 weeks gestation of pregnancy: Secondary | ICD-10-CM | POA: Diagnosis not present

## 2021-03-11 ENCOUNTER — Ambulatory Visit (INDEPENDENT_AMBULATORY_CARE_PROVIDER_SITE_OTHER): Payer: Medicaid Other | Admitting: Advanced Practice Midwife

## 2021-03-11 ENCOUNTER — Other Ambulatory Visit (HOSPITAL_COMMUNITY)
Admission: RE | Admit: 2021-03-11 | Discharge: 2021-03-11 | Disposition: A | Discharge status: Home / Self Care | Payer: Medicaid Other | Source: Ambulatory Visit | Attending: Advanced Practice Midwife | Admitting: Advanced Practice Midwife

## 2021-03-11 ENCOUNTER — Other Ambulatory Visit: Payer: Self-pay

## 2021-03-11 VITALS — BP 110/69 | HR 61 | Wt 152.0 lb

## 2021-03-11 DIAGNOSIS — O0993 Supervision of high risk pregnancy, unspecified, third trimester: Secondary | ICD-10-CM | POA: Insufficient documentation

## 2021-03-11 DIAGNOSIS — Z3A35 35 weeks gestation of pregnancy: Secondary | ICD-10-CM

## 2021-03-11 DIAGNOSIS — O99013 Anemia complicating pregnancy, third trimester: Secondary | ICD-10-CM

## 2021-03-11 DIAGNOSIS — A749 Chlamydial infection, unspecified: Secondary | ICD-10-CM

## 2021-03-11 DIAGNOSIS — R8271 Bacteriuria: Secondary | ICD-10-CM

## 2021-03-11 DIAGNOSIS — O24410 Gestational diabetes mellitus in pregnancy, diet controlled: Secondary | ICD-10-CM

## 2021-03-11 DIAGNOSIS — O98813 Other maternal infectious and parasitic diseases complicating pregnancy, third trimester: Secondary | ICD-10-CM

## 2021-03-11 LAB — OB RESULTS CONSOLE GC/CHLAMYDIA: Gonorrhea: NEGATIVE

## 2021-03-11 NOTE — Patient Instructions (Signed)
Go to [email or web address].org for the most up to date information on birth control options.

## 2021-03-11 NOTE — Progress Notes (Addendum)
   PRENATAL VISIT NOTE  Subjective:  Cheryl Kline is a 20 y.o. G1P0 at [redacted]w[redacted]d being seen today for ongoing prenatal care.  She is currently monitored for the following issues for this low-risk pregnancy and has Encounter for supervision of normal first pregnancy in first trimester; Asthma; Chlamydia infection affecting pregnancy; UTI in pregnancy; Anemia in pregnancy; History of appendectomy; GBS bacteriuria; and Gestational diabetes on their problem list.  Patient reports no complaints.  Contractions: Not present. Vag. Bleeding: None.  Movement: Present. Denies leaking of fluid.   The following portions of the patient's history were reviewed and updated as appropriate: allergies, current medications, past family history, past medical history, past social history, past surgical history and problem list.   Objective:   Vitals:   03/11/21 1355  BP: 110/69  Pulse: 61  Weight: 152 lb (68.9 kg)    Fetal Status: Fetal Heart Rate (bpm): 135 Fundal Height: 35 cm Movement: Present     General:  Alert, oriented and cooperative. Patient is in no acute distress.  Skin: Skin is warm and dry. No rash noted.   Cardiovascular: Normal heart rate noted  Respiratory: Normal respiratory effort, no problems with respiration noted  Abdomen: Soft, gravid, appropriate for gestational age.  Pain/Pressure: Absent     Pelvic: Cervical exam deferred        Extremities: Normal range of motion.  Edema: None  Mental Status: Normal mood and affect. Normal behavior. Normal judgment and thought content.   Assessment and Plan:  Pregnancy: G1P0 at [redacted]w[redacted]d 1. Supervision of high risk pregnancy in third trimester --Anticipatory guidance about next visits/weeks of pregnancy given. --GCC and GBS collected today --Next visit in 2 weeks  2. Diet controlled gestational diabetes mellitus (GDM) in third trimester --Reviewed glucose log. All fasting values below 95, only 1 above 90 out of 12+.  Postprandial values: 5 out of  60+ above 120, 2 at 130s, others low 120s. --Good glucose control, no changes to management at this time --IOL at 40 weeks --Growth US wnl, no more scheduled with MFM  3. Chlamydia infection affecting pregnancy in third trimester --Positive on 9/23, treated, Negative TOC on 10/17.  4. Anemia during pregnancy in third trimester --Hgb 9.5 on 10/23 --Iron infusion ordered 9/26--f/u?  5. GBS bacteriuria --Tx with amoxicillin 8/30. TOC today.  6. [redacted] weeks gestation of pregnancy   Preterm labor symptoms and general obstetric precautions including but not limited to vaginal bleeding, contractions, leaking of fluid and fetal movement were reviewed in detail with the patient. Please refer to After Visit Summary for other counseling recommendations.   Return in about 2 weeks (around 03/25/2021).  Future Appointments  Date Time Provider Department Center  03/25/2021  1:50 PM Milas Hock, MD [facility] None     Sharen Counter, CNM

## 2021-03-12 LAB — GC/CHLAMYDIA PROBE AMP (~~LOC~~) NOT AT ARMC
Chlamydia: NEGATIVE
Comment: NEGATIVE
Comment: NORMAL
Neisseria Gonorrhea: NEGATIVE

## 2021-03-14 LAB — CULTURE, BETA STREP (GROUP B ONLY): Strep Gp B Culture: POSITIVE — AB

## 2021-03-20 ENCOUNTER — Encounter: Payer: Self-pay | Admitting: Advanced Practice Midwife

## 2021-03-20 DIAGNOSIS — O99820 Streptococcus B carrier state complicating pregnancy: Secondary | ICD-10-CM | POA: Insufficient documentation

## 2021-03-20 LAB — CULTURE, OB URINE

## 2021-03-20 LAB — URINE CULTURE, OB REFLEX

## 2021-03-25 ENCOUNTER — Other Ambulatory Visit: Payer: Self-pay

## 2021-03-25 ENCOUNTER — Encounter: Payer: Self-pay | Admitting: Obstetrics and Gynecology

## 2021-03-25 ENCOUNTER — Ambulatory Visit (INDEPENDENT_AMBULATORY_CARE_PROVIDER_SITE_OTHER): Payer: Medicaid Other | Admitting: Obstetrics and Gynecology

## 2021-03-25 VITALS — BP 110/68 | HR 80 | Wt 158.0 lb

## 2021-03-25 DIAGNOSIS — O24410 Gestational diabetes mellitus in pregnancy, diet controlled: Secondary | ICD-10-CM

## 2021-03-25 DIAGNOSIS — Z3401 Encounter for supervision of normal first pregnancy, first trimester: Secondary | ICD-10-CM

## 2021-03-25 DIAGNOSIS — O99013 Anemia complicating pregnancy, third trimester: Secondary | ICD-10-CM

## 2021-03-25 DIAGNOSIS — O2343 Unspecified infection of urinary tract in pregnancy, third trimester: Secondary | ICD-10-CM

## 2021-03-25 NOTE — Progress Notes (Signed)
   PRENATAL VISIT NOTE  Subjective:  Cheryl Kline is a 20 y.o. G1P0 at [redacted]w[redacted]d being seen today for ongoing prenatal care.  She is currently monitored for the following issues for this low-risk pregnancy and has Encounter for supervision of normal first pregnancy in first trimester; Asthma; Chlamydia infection affecting pregnancy; UTI in pregnancy; Anemia in pregnancy; History of appendectomy; GBS bacteriuria; and Gestational diabetes on their problem list.  Patient reports no complaints.  Contractions: Irregular. Vag. Bleeding: None.  Movement: Present. Denies leaking of fluid.   The following portions of the patient's history were reviewed and updated as appropriate: allergies, current medications, past family history, past medical history, past social history, past surgical history and problem list.   Objective:   Vitals:   03/25/21 1349  BP: 110/68  Pulse: 80  Weight: 158 lb (71.7 kg)    Fetal Status: Fetal Heart Rate (bpm): 165 Fundal Height: 37 cm Movement: Present     General:  Alert, oriented and cooperative. Patient is in no acute distress.  Skin: Skin is warm and dry. No rash noted.   Cardiovascular: Normal heart rate noted  Respiratory: Normal respiratory effort, no problems with respiration noted  Abdomen: Soft, gravid, appropriate for gestational age.  Pain/Pressure: Present     Pelvic: Cervical exam deferred        Extremities: Normal range of motion.  Edema: Trace  Mental Status: Normal mood and affect. Normal behavior. Normal judgment and thought content.   Assessment and Plan:  Pregnancy: G1P0 at [redacted]w[redacted]d 1. Diet controlled gestational diabetes mellitus (GDM) in third trimester - CBGs fastings are all <90 per pt, fastings are usually <120 - Growth on 11/9 was 48%ile, normal AFI, cephalic - Continue diet control management.   2. Urinary tract infection in mother during third trimester of pregnancy - GBS in urine is persistent - Will do PCN in labor. Discussed with  pt  3. Encounter for supervision of normal first pregnancy in first trimester - FH/FHT normal  4. Anemia during pregnancy in third trimester - She has continued PO iron. Will check CBC today - CBC  Term labor symptoms and general obstetric precautions including but not limited to vaginal bleeding, contractions, leaking of fluid and fetal movement were reviewed in detail with the patient. Please refer to After Visit Summary for other counseling recommendations.   Return in about 1 week (around 04/01/2021) for OB VISIT, MD or APP.  No future appointments.  Milas Hock, MD

## 2021-03-26 LAB — CBC
Hematocrit: 33.2 % — ABNORMAL LOW (ref 34.0–46.6)
Hemoglobin: 10.6 g/dL — ABNORMAL LOW (ref 11.1–15.9)
MCH: 26.5 pg — ABNORMAL LOW (ref 26.6–33.0)
MCHC: 31.9 g/dL (ref 31.5–35.7)
MCV: 83 fL (ref 79–97)
Platelets: 285 10*3/uL (ref 150–450)
RBC: 4 x10E6/uL (ref 3.77–5.28)
RDW: 18.4 % — ABNORMAL HIGH (ref 11.7–15.4)
WBC: 5.9 10*3/uL (ref 3.4–10.8)

## 2021-04-02 ENCOUNTER — Other Ambulatory Visit: Payer: Self-pay

## 2021-04-02 ENCOUNTER — Encounter: Payer: Self-pay | Admitting: Nurse Practitioner

## 2021-04-02 ENCOUNTER — Ambulatory Visit (INDEPENDENT_AMBULATORY_CARE_PROVIDER_SITE_OTHER): Payer: Medicaid Other | Admitting: Nurse Practitioner

## 2021-04-02 VITALS — BP 118/79 | HR 78 | Wt 153.0 lb

## 2021-04-02 DIAGNOSIS — A749 Chlamydial infection, unspecified: Secondary | ICD-10-CM

## 2021-04-02 DIAGNOSIS — O24410 Gestational diabetes mellitus in pregnancy, diet controlled: Secondary | ICD-10-CM

## 2021-04-02 DIAGNOSIS — Z3A38 38 weeks gestation of pregnancy: Secondary | ICD-10-CM

## 2021-04-02 DIAGNOSIS — O98813 Other maternal infectious and parasitic diseases complicating pregnancy, third trimester: Secondary | ICD-10-CM

## 2021-04-02 DIAGNOSIS — R8271 Bacteriuria: Secondary | ICD-10-CM

## 2021-04-02 DIAGNOSIS — Z3401 Encounter for supervision of normal first pregnancy, first trimester: Secondary | ICD-10-CM

## 2021-04-02 NOTE — Progress Notes (Signed)
    Subjective:  Cheryl Kline is a 19 y.o. G1P0 at [redacted]w[redacted]d being seen today for ongoing prenatal care.  She is currently monitored for the following issues for this high-risk pregnancy and has Encounter for supervision of normal first pregnancy in first trimester; Asthma; Chlamydia infection affecting pregnancy; UTI in pregnancy; Anemia in pregnancy; History of appendectomy; GBS bacteriuria; and Gestational diabetes on their problem list.  Patient reports no complaints.  Contractions: Irregular. Vag. Bleeding: None.  Movement: Present. Denies leaking of fluid.   The following portions of the patient's history were reviewed and updated as appropriate: allergies, current medications, past family history, past medical history, past social history, past surgical history and problem list. Problem list updated.  Objective:   Vitals:   04/02/21 1522  BP: 118/79  Pulse: 78  Weight: 153 lb (69.4 kg)    Fetal Status: Fetal Heart Rate (bpm): 150 Fundal Height: 37 cm Movement: Present  Presentation: Vertex  General:  Alert, oriented and cooperative. Patient is in no acute distress.  Skin: Skin is warm and dry. No rash noted.   Cardiovascular: Normal heart rate noted  Respiratory: Normal respiratory effort, no problems with respiration noted  Abdomen: Soft, gravid, appropriate for gestational age. Pain/Pressure: Present     Pelvic:  Cervical exam performed Dilation: 1.5 Effacement (%): 80 Station: -2  Extremities: Normal range of motion.  Edema: Trace  Mental Status: Normal mood and affect. Normal behavior. Normal judgment and thought content.   Urinalysis:      Assessment and Plan:  Pregnancy: G1P0 at [redacted]w[redacted]d  1. Encounter for supervision of normal first pregnancy in first trimester Doing well  2. Diet controlled gestational diabetes mellitus (GDM) in third trimester Reviewed blood sugar log - Missing 2 days - plans to get more lancets. All fasting levels are under 90.  Only one postprandial  above 120  - meets A1 criteria - BPP not ordered based on criteria in UpToDate Discussed induction - desires at 40 w - to schedule.  Orders placed  3. GBS bacteriuria Will treat in labor - no allergies to penicillin  4. Chlamydia infection affecting pregnancy in third trimester Testing done today - has been negative since October    Term labor symptoms and general obstetric precautions including but not limited to vaginal bleeding, contractions, leaking of fluid and fetal movement were reviewed in detail with the patient. Please refer to After Visit Summary for other counseling recommendations.  Return in about 1 week (around 04/09/2021) for in person ROB.  Nolene Bernheim, RN, MSN, NP-BC Nurse Practitioner, [facility] for [employer], [facility] 04/02/2021 3:56 PM

## 2021-04-03 ENCOUNTER — Other Ambulatory Visit: Payer: Self-pay | Admitting: Advanced Practice Midwife

## 2021-04-03 ENCOUNTER — Encounter (HOSPITAL_COMMUNITY): Payer: Medicaid Other

## 2021-04-04 ENCOUNTER — Inpatient Hospital Stay (HOSPITAL_COMMUNITY): Payer: Medicaid Other | Admitting: Anesthesiology

## 2021-04-04 ENCOUNTER — Other Ambulatory Visit: Payer: Self-pay

## 2021-04-04 ENCOUNTER — Inpatient Hospital Stay (HOSPITAL_COMMUNITY)
Admission: AD | Admit: 2021-04-04 | Discharge: 2021-04-06 | DRG: 806 | Disposition: A | Discharge status: Home / Self Care | Payer: Medicaid Other | Attending: Obstetrics & Gynecology | Admitting: Obstetrics & Gynecology

## 2021-04-04 ENCOUNTER — Encounter (HOSPITAL_COMMUNITY): Payer: Self-pay | Admitting: Obstetrics & Gynecology

## 2021-04-04 DIAGNOSIS — J101 Influenza due to other identified influenza virus with other respiratory manifestations: Secondary | ICD-10-CM | POA: Diagnosis present

## 2021-04-04 DIAGNOSIS — J45909 Unspecified asthma, uncomplicated: Secondary | ICD-10-CM | POA: Diagnosis present

## 2021-04-04 DIAGNOSIS — O24420 Gestational diabetes mellitus in childbirth, diet controlled: Secondary | ICD-10-CM | POA: Diagnosis present

## 2021-04-04 DIAGNOSIS — Z20822 Contact with and (suspected) exposure to covid-19: Secondary | ICD-10-CM | POA: Diagnosis present

## 2021-04-04 DIAGNOSIS — O9952 Diseases of the respiratory system complicating childbirth: Secondary | ICD-10-CM | POA: Diagnosis present

## 2021-04-04 DIAGNOSIS — Z3A38 38 weeks gestation of pregnancy: Secondary | ICD-10-CM | POA: Diagnosis not present

## 2021-04-04 DIAGNOSIS — O99824 Streptococcus B carrier state complicating childbirth: Secondary | ICD-10-CM | POA: Diagnosis present

## 2021-04-04 DIAGNOSIS — O24419 Gestational diabetes mellitus in pregnancy, unspecified control: Secondary | ICD-10-CM

## 2021-04-04 DIAGNOSIS — R8271 Bacteriuria: Secondary | ICD-10-CM

## 2021-04-04 DIAGNOSIS — O9852 Other viral diseases complicating childbirth: Secondary | ICD-10-CM | POA: Diagnosis present

## 2021-04-04 LAB — CBC
HCT: 36.9 % (ref 36.0–46.0)
Hemoglobin: 11.9 g/dL — ABNORMAL LOW (ref 12.0–15.0)
MCH: 26.5 pg (ref 26.0–34.0)
MCHC: 32.2 g/dL (ref 30.0–36.0)
MCV: 82.2 fL (ref 80.0–100.0)
Platelets: 295 10*3/uL (ref 150–400)
RBC: 4.49 MIL/uL (ref 3.87–5.11)
RDW: 19.3 % — ABNORMAL HIGH (ref 11.5–15.5)
WBC: 8.1 10*3/uL (ref 4.0–10.5)
nRBC: 0 % (ref 0.0–0.2)

## 2021-04-04 LAB — RESP PANEL BY RT-PCR (FLU A&B, COVID) ARPGX2
Influenza A by PCR: POSITIVE — AB
Influenza B by PCR: NEGATIVE
SARS Coronavirus 2 by RT PCR: NEGATIVE

## 2021-04-04 LAB — TYPE AND SCREEN
ABO/RH(D): O POS
Antibody Screen: NEGATIVE

## 2021-04-04 LAB — GLUCOSE, CAPILLARY: Glucose-Capillary: 78 mg/dL (ref 70–99)

## 2021-04-04 MED ORDER — ONDANSETRON HCL 4 MG/2ML IJ SOLN
4.0000 mg | Freq: Four times a day (QID) | INTRAMUSCULAR | Status: DC | PRN
  Administered 2021-04-04: 4 mg via INTRAVENOUS
  Filled 2021-04-04: qty 2

## 2021-04-04 MED ORDER — DIPHENHYDRAMINE HCL 50 MG/ML IJ SOLN: 25.0000 mg | Freq: Once | INTRAMUSCULAR | Status: DC | PRN

## 2021-04-04 MED ORDER — OXYTOCIN-SODIUM CHLORIDE 30-0.9 UT/500ML-% IV SOLN: 2.5000 [IU]/h | INTRAVENOUS | Status: DC

## 2021-04-04 MED ORDER — LACTATED RINGERS IV SOLN: 500.0000 mL | Freq: Once | INTRAVENOUS | Status: DC

## 2021-04-04 MED ORDER — OXYCODONE-ACETAMINOPHEN 5-325 MG PO TABS: 2.0000 | ORAL_TABLET | ORAL | Status: DC | PRN

## 2021-04-04 MED ORDER — SODIUM CHLORIDE 0.9 % IV SOLN
500.0000 mg | INTRAVENOUS | Status: DC
  Administered 2021-04-04: 500 mg via INTRAVENOUS
  Filled 2021-04-04: qty 25

## 2021-04-04 MED ORDER — ACETAMINOPHEN 325 MG PO TABS: 650.0000 mg | ORAL_TABLET | ORAL | Status: DC | PRN

## 2021-04-04 MED ORDER — OXYCODONE-ACETAMINOPHEN 5-325 MG PO TABS: 1.0000 | ORAL_TABLET | ORAL | Status: DC | PRN

## 2021-04-04 MED ORDER — FENTANYL-BUPIVACAINE-NACL 0.5-0.125-0.9 MG/250ML-% EP SOLN
12.0000 mL/h | EPIDURAL | Status: DC | PRN
  Administered 2021-04-04: 12 mL/h via EPIDURAL
  Filled 2021-04-04: qty 250

## 2021-04-04 MED ORDER — LIDOCAINE HCL (PF) 1 % IJ SOLN
INTRAMUSCULAR | Status: DC | PRN
  Administered 2021-04-04: 4 mL via EPIDURAL
  Administered 2021-04-04: 5 mL via EPIDURAL

## 2021-04-04 MED ORDER — TERBUTALINE SULFATE 1 MG/ML IJ SOLN: 0.2500 mg | Freq: Once | INTRAMUSCULAR | Status: DC | PRN

## 2021-04-04 MED ORDER — FENTANYL CITRATE (PF) 100 MCG/2ML IJ SOLN
100.0000 ug | INTRAMUSCULAR | Status: DC | PRN
  Administered 2021-04-04 – 2021-04-05 (×2): 100 ug via INTRAVENOUS
  Filled 2021-04-04: qty 2

## 2021-04-04 MED ORDER — PHENYLEPHRINE 40 MCG/ML (10ML) SYRINGE FOR IV PUSH (FOR BLOOD PRESSURE SUPPORT): 80.0000 ug | PREFILLED_SYRINGE | INTRAVENOUS | Status: DC | PRN

## 2021-04-04 MED ORDER — DIPHENHYDRAMINE HCL 50 MG/ML IJ SOLN: 12.5000 mg | INTRAMUSCULAR | Status: DC | PRN

## 2021-04-04 MED ORDER — EPHEDRINE 5 MG/ML INJ: 10.0000 mg | INTRAVENOUS | Status: DC | PRN

## 2021-04-04 MED ORDER — SODIUM CHLORIDE 0.9 % IV SOLN: INTRAVENOUS | Status: DC | PRN

## 2021-04-04 MED ORDER — ALBUTEROL SULFATE (2.5 MG/3ML) 0.083% IN NEBU: 2.5000 mg | INHALATION_SOLUTION | Freq: Once | RESPIRATORY_TRACT | Status: DC | PRN

## 2021-04-04 MED ORDER — SODIUM CHLORIDE 0.9 % IV BOLUS: 500.0000 mL | Freq: Once | INTRAVENOUS | Status: DC | PRN

## 2021-04-04 MED ORDER — LACTATED RINGERS IV SOLN: INTRAVENOUS | Status: DC

## 2021-04-04 MED ORDER — OXYTOCIN-SODIUM CHLORIDE 30-0.9 UT/500ML-% IV SOLN
1.0000 m[IU]/min | INTRAVENOUS | Status: DC
Start: 2021-04-04 — End: 2021-04-05
  Filled 2021-04-04: qty 500

## 2021-04-04 MED ORDER — METHYLPREDNISOLONE SODIUM SUCC 125 MG IJ SOLR: 125.0000 mg | Freq: Once | INTRAMUSCULAR | Status: DC | PRN

## 2021-04-04 MED ORDER — SODIUM CHLORIDE 0.9 % IV SOLN
5.0000 10*6.[IU] | Freq: Once | INTRAVENOUS | Status: AC
  Administered 2021-04-04: 5 10*6.[IU] via INTRAVENOUS
  Filled 2021-04-04: qty 5

## 2021-04-04 MED ORDER — SOD CITRATE-CITRIC ACID 500-334 MG/5ML PO SOLN: 30.0000 mL | ORAL | Status: DC | PRN

## 2021-04-04 MED ORDER — EPINEPHRINE PF 1 MG/ML IJ SOLN
0.3000 mg | Freq: Once | INTRAMUSCULAR | Status: DC | PRN
  Filled 2021-04-04: qty 1

## 2021-04-04 MED ORDER — FENTANYL CITRATE (PF) 100 MCG/2ML IJ SOLN
INTRAMUSCULAR | Status: AC
  Filled 2021-04-04: qty 2

## 2021-04-04 MED ORDER — OXYTOCIN BOLUS FROM INFUSION
333.0000 mL | Freq: Once | INTRAVENOUS | Status: AC
  Administered 2021-04-05: 333 mL via INTRAVENOUS

## 2021-04-04 MED ORDER — EPINEPHRINE PF 1 MG/ML IJ SOLN: 0.3000 mg | Freq: Once | INTRAMUSCULAR | Status: DC | PRN

## 2021-04-04 MED ORDER — FLEET ENEMA 7-19 GM/118ML RE ENEM: 1.0000 | ENEMA | RECTAL | Status: DC | PRN

## 2021-04-04 MED ORDER — LIDOCAINE HCL (PF) 1 % IJ SOLN
30.0000 mL | INTRAMUSCULAR | Status: AC | PRN
  Administered 2021-04-05: 30 mL via SUBCUTANEOUS
  Filled 2021-04-04: qty 30

## 2021-04-04 MED ORDER — LACTATED RINGERS IV SOLN: 500.0000 mL | INTRAVENOUS | Status: DC | PRN

## 2021-04-04 MED ORDER — PENICILLIN G POT IN DEXTROSE 60000 UNIT/ML IV SOLN
3.0000 10*6.[IU] | INTRAVENOUS | Status: DC
  Administered 2021-04-05: 3 10*6.[IU] via INTRAVENOUS
  Filled 2021-04-04: qty 50

## 2021-04-04 NOTE — Anesthesia Procedure Notes (Signed)
Epidural Patient location during procedure: OB Start time: 04/04/2021 10:24 PM End time: 04/04/2021 10:28 PM  Staffing Anesthesiologist: Beryle Lathe, MD Performed: anesthesiologist   Preanesthetic Checklist Completed: patient identified, IV checked, risks and benefits discussed, monitors and equipment checked, pre-op evaluation and timeout performed  Epidural Patient position: sitting Prep: DuraPrep Patient monitoring: continuous pulse ox and blood pressure Approach: midline Location: L2-L3 Injection technique: LOR saline  Needle:  Needle type: Tuohy  Needle gauge: 17 G Needle length: 9 cm Needle insertion depth: 7 cm Catheter size: 19 Gauge Catheter at skin depth: 12 cm Test dose: negative and Other (1% lidocaine)  Assessment Events: blood not aspirated  Additional Notes Patient identified. Risks including, but not limited to, bleeding, infection, nerve damage, paralysis, inadequate analgesia, blood pressure changes, nausea, vomiting, allergic reaction, postpartum back pain, itching, and headache were discussed. Patient expressed understanding and wished to proceed. Sterile prep and drape, including hand hygiene, mask, and sterile gloves were used. The patient was positioned and the spine was prepped. The skin was anesthetized with lidocaine. No paraesthesia or other complication noted. The patient did not experience any signs of intravascular injection such as tinnitus or metallic taste in mouth, nor signs of intrathecal spread such as rapid motor block. Please see nursing notes for vital signs. The patient tolerated the procedure well.   Leslye Peer, MDReason for block:procedure for pain

## 2021-04-04 NOTE — MAU Note (Signed)
Patient came in contracting since this morning at 0800 with pain of 10/10 about every 5-7 mins (stated by patient). Patient has bloody show and states that she has no leaking of fluids.

## 2021-04-04 NOTE — H&P (Signed)
Obstetric History and Physical  Cheryl Kline is a 20 y.o. G1P0 with IUP at [redacted]w[redacted]d and A1GDM presenting for labor symptoms. Patient states she has been having  regular, every 5-7 minutes contractions since this morning, minimal vaginal bleeding, intact membranes, with active fetal movement.    Patient reported coughing since today, no SOB, no fevers.  Prenatal Course Source of Care: [facility]  with onset of care at 17 weeks Pregnancy complications or risks: Patient Active Problem List   Diagnosis Date Noted   Gestational diabetes 01/21/2021   GBS bacteriuria 12/25/2020   Chlamydia infection affecting pregnancy 11/25/2020   UTI in pregnancy 11/25/2020   Anemia in pregnancy 11/25/2020   Supervision of high-risk pregnancy 11/21/2020   Asthma 11/21/2020   Nursing Staff Provider  Office Location  [facility] Dating  Early [place] (8 wks)  Language   English Anatomy US  Normal  Flu Vaccine  02/01/2021 Genetic/Carrier Screen  AFP:   negative   TDaP Vaccine   02/01/2021 GTT Third trimester Abnormal >>GDM  COVID Vaccine    LAB RESULTS   Rhogam  O+ Blood Type --/--/O POS (07/28 0807)   Baby Feeding Plan Breast Antibody Negative (07/27 1559)  Contraception pill Rubella 1.14 (07/27 1559)  Circumcision no RPR Non Reactive (09/23 0911)   Pediatrician   Dr Nelda Marseille $RemoveBeforeD'@[email or web address]'[email or web address]$  HBsAg Negative (07/27 1559)   Support Person FOB HCVAb Negative  Prenatal Classes No HIV Non Reactive (09/23 0911)       GBS  POSITIVE IN URINE    Pap Referred to [facility]   Past Medical History:  Diagnosis Date   Asthma     Past Surgical History:  Procedure Laterality Date   APPENDECTOMY      OB History  Gravida Para Term Preterm AB Living  1            SAB IAB Ectopic Multiple Live Births               # Outcome Date GA Lbr Len/2nd Weight Sex Delivery Anes PTL Lv  1 Current             Social History   Socioeconomic History   Marital status: Single    Spouse name: Not on file   Number of children: Not on  file   Years of education: Not on file   Highest education level: Not on file  Occupational History   Not on file  Tobacco Use   Smoking status: Never   Smokeless tobacco: Never  Vaping Use   Vaping Use: Not on file  Substance and Sexual Activity   Alcohol use: Not Currently   Drug use: Not Currently   Sexual activity: Yes  Other Topics Concern   Not on file  Social History Narrative   Not on file   Social Determinants of Health   Financial Resource Strain: Not on file  Food Insecurity: Not on file  Transportation Needs: Not on file  Physical Activity: Not on file  Stress: Not on file  Social Connections: Not on file    Family History  Problem Relation Age of Onset   Cancer Father    Cancer Paternal Grandmother     Medications Prior to Admission  Medication Sig Dispense Refill Last Dose   Accu-Chek Softclix Lancets lancets 1 each by Other route 4 (four) times daily. 100 each 12 04/03/2021   Blood Glucose Monitoring Suppl (ACCU-CHEK GUIDE) w/Device KIT 1 Device by Does not apply route 4 (four) times daily.  1 kit 0 04/03/2021   ferrous sulfate 324 (65 Fe) MG TBEC Take 1 tablet (325 mg total) by mouth every other day. 30 tablet 3 04/03/2021   glucose blood (ACCU-CHEK GUIDE) test strip Use to check blood sugars four times a day was instructed 50 each 12 04/03/2021   Prenat-Fe Poly-Methfol-FA-DHA (VITAFOL ULTRA) 29-0.6-0.4-200 MG CAPS Take 1 capsule by mouth daily before breakfast. 90 capsule 4 Past Week   albuterol (VENTOLIN HFA) 108 (90 Base) MCG/ACT inhaler Inhale into the lungs every 6 (six) hours as needed for wheezing or shortness of breath.   More than a month   cefadroxil (DURICEF) 500 MG capsule Take 1 capsule (500 mg total) by mouth 2 (two) times daily. (Patient not taking: Reported on 03/06/2021) 14 capsule 0     No Known Allergies  Review of Systems: Negative except for what is mentioned in HPI.  Physical Exam: BP 117/82   Pulse 81   Temp 98.1 F (36.7 C)  (Oral)   Resp 16   Ht 5' (1.524 m)   Wt 70.3 kg   LMP 06/22/2020   SpO2 100%   BMI 30.27 kg/m  CONSTITUTIONAL: Well-developed, well-nourished female in no acute distress.  HENT:  Normocephalic, atraumatic, External right and left ear normal. Oropharynx is clear and moist EYES: Conjunctivae and EOM are normal. Pupils are equal, round, and reactive to light. No scleral icterus.  NECK: Normal range of motion, supple, no masses SKIN: Skin is warm and dry. No rash noted. Not diaphoretic. No erythema. No pallor. NEUROLOGIC: Alert and oriented to person, place, and time. Normal reflexes, muscle tone coordination. No cranial nerve deficit noted. PSYCHIATRIC: Normal mood and affect. Normal behavior. Normal judgment and thought content. CARDIOVASCULAR: Normal heart rate noted, regular rhythm RESPIRATORY: Effort and breath sounds normal, no problems with respiration noted ABDOMEN: Soft, nontender, nondistended, gravid. MUSCULOSKELETAL: Normal range of motion. No edema and no tenderness. 2+ distal pulses.  Cervical Exam: Dilation: 4 Effacement (%): 90 Cervical Position: Middle Station: 0 Presentation: Vertex Exam by:: Gaylyn Rong RN  FHT:  Baseline rate 135 bpm   Variability moderate  Accelerations present   Decelerations variable Contractions: Every 2-4 mins   Pertinent Labs/Studies:   Results for orders placed or performed during the hospital encounter of 04/04/21 (from the past 24 hour(s))  Type and screen     Status: None (Preliminary result)   Collection Time: 04/04/21  8:13 PM  Result Value Ref Range   ABO/RH(D) PENDING    Antibody Screen PENDING    Sample Expiration      04/07/2021,2359 Performed at [facility], [place] [address] [place], [place] [address]   CBC     Status: Abnormal   Collection Time: 04/04/21  8:30 PM  Result Value Ref Range   WBC 8.1 4.0 - 10.5 K/uL   RBC 4.49 3.87 - 5.11 MIL/uL   Hemoglobin 11.9 (L) 12.0 - 15.0 g/dL   HCT 36.9 36.0 - 46.0 %   MCV  82.2 80.0 - 100.0 fL   MCH 26.5 26.0 - 34.0 pg   MCHC 32.2 30.0 - 36.0 g/dL   RDW 19.3 (H) 11.5 - 15.5 %   Platelets 295 150 - 400 K/uL   nRBC 0.0 0.0 - 0.2 %  Glucose, capillary     Status: None   Collection Time: 04/04/21  8:46 PM  Result Value Ref Range   Glucose-Capillary 78 70 - 99 mg/dL    Assessment : Cheryl Kline is a 20  y.o. G1P0 with A1GDM at 80w6dbeing admitted for labor.  Plan: Labor: Expectant management for now. Augmentation as ordered as per protocol. Epidural ordered as desired. A1GDM: CBGs to be checked q4h for now, then q2h in active labor. Will treat if >120. Cough: COVID pending, will follow up results and manage accordingly. Continue to monitor. FWB: Reassuring fetal heart tracing currently.  GBS positive, PCN ordered. Delivery plan: Hopeful for vaginal delivery   UVerita Schneiders MD, FIsanti FAroost[facility] Residential Treatment Facilityfor [employer] [place]

## 2021-04-04 NOTE — Anesthesia Preprocedure Evaluation (Signed)
Anesthesia Evaluation  Patient identified by MRN, date of birth, ID band Patient awake    Reviewed: Allergy & Precautions, NPO status , Patient's Chart, lab work & pertinent test results  History of Anesthesia Complications Negative for: history of anesthetic complications  Airway Mallampati: II   Neck ROM: Full    Dental   Pulmonary asthma ,   Flu    Pulmonary exam normal        Cardiovascular negative cardio ROS Normal cardiovascular exam     Neuro/Psych negative neurological ROS  negative psych ROS   GI/Hepatic negative GI ROS, Neg liver ROS,   Endo/Other  diabetes  Renal/GU negative Renal ROS     Musculoskeletal negative musculoskeletal ROS (+)   Abdominal   Peds  Hematology  (+) anemia ,  Plt 295k    Anesthesia Other Findings   Reproductive/Obstetrics (+) Pregnancy                             Anesthesia Physical Anesthesia Plan  ASA: 2  Anesthesia Plan: Epidural   Post-op Pain Management:    Induction:   PONV Risk Score and Plan: 2 and Treatment may vary due to age or medical condition  Airway Management Planned: Natural Airway  Additional Equipment: None  Intra-op Plan:   Post-operative Plan:   Informed Consent: I have reviewed the patients History and Physical, chart, labs and discussed the procedure including the risks, benefits and alternatives for the proposed anesthesia with the patient or authorized representative who has indicated his/her understanding and acceptance.       Plan Discussed with: Anesthesiologist  Anesthesia Plan Comments: (Labs reviewed. Platelets acceptable, patient not taking any blood thinning medications. Per RN, FHR tracing reported to be stable enough for sitting procedure. Risks and benefits discussed with patient, including PDPH, backache, epidural hematoma, failed epidural, blood pressure changes, allergic reaction, and nerve  injury. Patient expressed understanding and wished to proceed.)        Anesthesia Quick Evaluation

## 2021-04-04 NOTE — MAU Note (Signed)
Ctxs since this am. Some bloody show. Good FM. Loose 1cm last sve

## 2021-04-05 ENCOUNTER — Encounter (HOSPITAL_COMMUNITY): Payer: Self-pay | Admitting: Family Medicine

## 2021-04-05 DIAGNOSIS — O24420 Gestational diabetes mellitus in childbirth, diet controlled: Secondary | ICD-10-CM

## 2021-04-05 DIAGNOSIS — Z3A38 38 weeks gestation of pregnancy: Secondary | ICD-10-CM

## 2021-04-05 DIAGNOSIS — J101 Influenza due to other identified influenza virus with other respiratory manifestations: Secondary | ICD-10-CM | POA: Diagnosis present

## 2021-04-05 DIAGNOSIS — O99824 Streptococcus B carrier state complicating childbirth: Secondary | ICD-10-CM

## 2021-04-05 LAB — GLUCOSE, CAPILLARY
Glucose-Capillary: 83 mg/dL (ref 70–99)
Glucose-Capillary: 98 mg/dL (ref 70–99)

## 2021-04-05 LAB — RPR: RPR Ser Ql: NONREACTIVE

## 2021-04-05 MED ORDER — MENTHOL 3 MG MT LOZG
1.0000 | LOZENGE | OROMUCOSAL | Status: DC | PRN
  Administered 2021-04-05: 3 mg via ORAL
  Filled 2021-04-05: qty 9

## 2021-04-05 MED ORDER — BENZOCAINE-MENTHOL 20-0.5 % EX AERO
1.0000 "application " | INHALATION_SPRAY | CUTANEOUS | Status: DC | PRN
  Administered 2021-04-05: 1 via TOPICAL
  Filled 2021-04-05: qty 56

## 2021-04-05 MED ORDER — TETANUS-DIPHTH-ACELL PERTUSSIS 5-2.5-18.5 LF-MCG/0.5 IM SUSY: 0.5000 mL | PREFILLED_SYRINGE | Freq: Once | INTRAMUSCULAR | Status: DC

## 2021-04-05 MED ORDER — MEASLES, MUMPS & RUBELLA VAC IJ SOLR: 0.5000 mL | Freq: Once | INTRAMUSCULAR | Status: DC

## 2021-04-05 MED ORDER — ONDANSETRON HCL 4 MG/2ML IJ SOLN: 4.0000 mg | INTRAMUSCULAR | Status: DC | PRN

## 2021-04-05 MED ORDER — SENNOSIDES-DOCUSATE SODIUM 8.6-50 MG PO TABS
2.0000 | ORAL_TABLET | ORAL | Status: DC
  Administered 2021-04-05 – 2021-04-06 (×2): 2 via ORAL
  Filled 2021-04-05 (×2): qty 2

## 2021-04-05 MED ORDER — PRENATAL MULTIVITAMIN CH
1.0000 | ORAL_TABLET | Freq: Every day | ORAL | Status: DC
  Administered 2021-04-05 – 2021-04-06 (×2): 1 via ORAL
  Filled 2021-04-05: qty 1

## 2021-04-05 MED ORDER — ACETAMINOPHEN 325 MG PO TABS
650.0000 mg | ORAL_TABLET | ORAL | Status: DC | PRN
  Administered 2021-04-05: 650 mg via ORAL
  Filled 2021-04-05: qty 2

## 2021-04-05 MED ORDER — WITCH HAZEL-GLYCERIN EX PADS: 1.0000 "application " | MEDICATED_PAD | CUTANEOUS | Status: DC | PRN

## 2021-04-05 MED ORDER — OSELTAMIVIR PHOSPHATE 75 MG PO CAPS
75.0000 mg | ORAL_CAPSULE | Freq: Two times a day (BID) | ORAL | Status: DC
  Administered 2021-04-05 – 2021-04-06 (×3): 75 mg via ORAL
  Filled 2021-04-05 (×5): qty 1

## 2021-04-05 MED ORDER — COCONUT OIL OIL: 1.0000 "application " | TOPICAL_OIL | Status: DC | PRN

## 2021-04-05 MED ORDER — ONDANSETRON HCL 4 MG PO TABS: 4.0000 mg | ORAL_TABLET | ORAL | Status: DC | PRN

## 2021-04-05 MED ORDER — BUPIVACAINE HCL (PF) 0.25 % IJ SOLN
INTRAMUSCULAR | Status: DC | PRN
  Administered 2021-04-05: 8 mL via EPIDURAL
  Administered 2021-04-05: 5 mL via EPIDURAL

## 2021-04-05 MED ORDER — OSELTAMIVIR PHOSPHATE 75 MG PO CAPS
75.0000 mg | ORAL_CAPSULE | Freq: Two times a day (BID) | ORAL | Status: DC
  Administered 2021-04-05: 75 mg via ORAL
  Filled 2021-04-05 (×2): qty 1

## 2021-04-05 MED ORDER — DIPHENHYDRAMINE HCL 25 MG PO CAPS: 25.0000 mg | ORAL_CAPSULE | Freq: Four times a day (QID) | ORAL | Status: DC | PRN

## 2021-04-05 MED ORDER — IBUPROFEN 600 MG PO TABS
600.0000 mg | ORAL_TABLET | Freq: Four times a day (QID) | ORAL | Status: DC
  Administered 2021-04-05 – 2021-04-06 (×6): 600 mg via ORAL
  Filled 2021-04-05 (×5): qty 1

## 2021-04-05 MED ORDER — SIMETHICONE 80 MG PO CHEW: 80.0000 mg | CHEWABLE_TABLET | ORAL | Status: DC | PRN

## 2021-04-05 MED ORDER — DM-GUAIFENESIN ER 30-600 MG PO TB12
1.0000 | ORAL_TABLET | Freq: Two times a day (BID) | ORAL | Status: AC
  Administered 2021-04-05 – 2021-04-06 (×3): 1 via ORAL
  Filled 2021-04-05 (×4): qty 1

## 2021-04-05 MED ORDER — DIBUCAINE (PERIANAL) 1 % EX OINT: 1.0000 "application " | TOPICAL_OINTMENT | CUTANEOUS | Status: DC | PRN

## 2021-04-05 NOTE — Progress Notes (Signed)
Was told that patient is now fully dilated and pushing.  Patient found to have fetus at +2 station, but recurrent deep variable decelerations present.  After a couple of prolonged decels without further descent, and no recovery with re-positioning, patient counseled about vacuum assistance and she agreed.  Please refer to operative vaginal delivery note for further details.    Jaynie Collins, MD, FACOG Obstetrician & Gynecologist, [facility] for [employer], [facility]

## 2021-04-05 NOTE — Lactation Note (Signed)
This note was copied from a baby's chart. Lactation Consultation Note  Patient Name: Cheryl Kline [id number] Date: 04/05/2021 Reason for consult: Initial assessment;Term;Primapara;1st time breastfeeding Age:22 hours  P1 mother whose infant is now 47 hours old.  This is a term baby at 39+0 weeks.  Baby "Cheryl Kline" was asleep STS on mother's chest when I arrived.  Reviewed feeding cues and breast feeding basics.  Encouraged to feed 8-12 times/24 hours or sooner if baby shows cues.  Offered to return as desired for latch assistance; mother willing to review hand expression at the next visit.  Father present and asleep on the couch.   Maternal Data Has patient been taught Hand Expression?: No (Mother familar; will review when she calls for latch assistance) Does the patient have breastfeeding experience prior to this delivery?: No  Feeding Mother's Current Feeding Choice: Breast Milk  LATCH Score Latch: Too sleepy or reluctant, no latch achieved, no sucking elicited.  Audible Swallowing: None  Type of Nipple: Everted at rest and after stimulation  Comfort (Breast/Nipple): Soft / non-tender  Hold (Positioning): Assistance needed to correctly position infant at breast and maintain latch.  LATCH Score: 5   Lactation Tools Discussed/Used    Interventions Interventions: Breast feeding basics reviewed;Education;LC Services brochure  Discharge Pump:  (Mother plans to call WIC for a DEBP) WIC Program: Yes [facility])  Consult Status Consult Status: Follow-up Date: 04/06/21 Follow-up type: In-patient    Deardra Hinkley R Jahki Witham 04/05/2021, 7:34 AM

## 2021-04-05 NOTE — Progress Notes (Signed)
Cheryl Kline is a 20 y.o. G1P0 at 102w0d admitted for labor  Subjective: Still reports significant pain despite epidural, has been crying as per RN. Patient also reported possible leaking of fluid at 2335.  Objective: BP 118/72   Pulse 67   Temp 98.1 F (36.7 C) (Oral)   Resp 16   Ht 5' (1.524 m)   Wt 70.3 kg   LMP 06/22/2020   SpO2 99%   BMI 30.27 kg/m  No intake/output data recorded. No intake/output data recorded.  FHT:  FHR: 130 bpm, variability: moderate,  accelerations:  Present,  decelerations:  Present (early) UC:   regular, every 3-4 minutes SVE:   Dilation: 6 Effacement (%): 80 Station: -2 Exam by:: Nathanuel Cabreja, MD  Labs: Results for orders placed or performed during the hospital encounter of 04/04/21 (from the past 24 hour(s))  Type and screen     Status: None   Collection Time: 04/04/21  8:13 PM  Result Value Ref Range   ABO/RH(D) O POS    Antibody Screen NEG    Sample Expiration      04/07/2021,2359 Performed at [facility], [address], [place], [place] [address]   CBC     Status: Abnormal   Collection Time: 04/04/21  8:30 PM  Result Value Ref Range   WBC 8.1 4.0 - 10.5 K/uL   RBC 4.49 3.87 - 5.11 MIL/uL   Hemoglobin 11.9 (L) 12.0 - 15.0 g/dL   HCT 78.2 95.6 - 21.3 %   MCV 82.2 80.0 - 100.0 fL   MCH 26.5 26.0 - 34.0 pg   MCHC 32.2 30.0 - 36.0 g/dL   RDW 08.6 (H) 57.8 - 46.9 %   Platelets 295 150 - 400 K/uL   nRBC 0.0 0.0 - 0.2 %  Resp Panel by RT-PCR (Flu A&B, Covid) Nasopharyngeal Swab     Status: Abnormal   Collection Time: 04/04/21  8:40 PM   Specimen: Nasopharyngeal Swab; Nasopharyngeal(NP) swabs in vial transport medium  Result Value Ref Range   SARS Coronavirus 2 by RT PCR NEGATIVE NEGATIVE   Influenza A by PCR POSITIVE (A) NEGATIVE   Influenza B by PCR NEGATIVE NEGATIVE  Glucose, capillary     Status: None   Collection Time: 04/04/21  8:46 PM  Result Value Ref Range   Glucose-Capillary 78 70 - 99 mg/dL    Assessment /  Plan: Spontaneous labor, progressing normally.   Labor: Progressing normally. SROM around 2335. I/D:  Patient has Influenza A, will be started on Tamiflu. Respiratory precautions instituted. No respiratory distress, no fevers currently.  A1GDM: Stable blood sugars, will check q2h. Fetal Wellbeing:  Category I.   GBS Status: Positive, receiving PCN prophylaxis.  Pain Control:  Epidural, Anesthesiologist notified of inadequate pain control.  Anticipated MOD:   Hopeful for vaginal delivery   Jaynie Collins, MD 04/05/2021, 12:17 AM

## 2021-04-05 NOTE — Discharge Summary (Signed)
Postpartum Discharge Summary     Patient Name: Cheryl Kline DOB: 2001/04/22 MRN: [id number]  Date of admission: 04/04/2021 Delivery date:04/05/2021  Delivering provider: Verita Schneiders A  Date of discharge: 04/06/2021  Admitting diagnosis: Gestational diabetes [O24.419] Intrauterine pregnancy: [redacted]w[redacted]d     Secondary diagnosis:  Principal Problem:   Gestational diabetes Active Problems:   GBS bacteriuria   Influenza A  Additional problems: Labor    Discharge diagnosis: Term Pregnancy Delivered                                              Post partum procedures: None Augmentation: AROM Complications: None  Hospital course: Onset of Labor With Vaginal Delivery      20 y.o. yo G1P1001 at [redacted]w[redacted]d was admitted in Latent Labor on 04/04/2021. Patient had an uncomplicated labor course as follows:  Membrane Rupture Time/Date: 11:35 PM ,04/04/2021   Delivery Method:Vaginal, Vacuum (Extractor)  Episiotomy: None  Lacerations:  1st degree;Perineal  Patient had an uncomplicated postpartum course.  She is ambulating, tolerating a regular diet, passing flatus, and urinating well. Patient is discharged home in stable condition on 04/06/21.  Newborn Data: Birth date:04/05/2021  Birth time:3:19 AM  Gender:Female  Living status:Living  Apgars:9 ,9  Weight:2892 g   Magnesium Sulfate received: No BMZ received: No Rhophylac:N/A MMR:N/A T-DaP:Given prenatally Flu: Yes Transfusion:No  Physical exam  Vitals:   04/05/21 1340 04/05/21 1939 04/05/21 2321 04/06/21 0513  BP: 110/63 100/74 93/63 106/66  Pulse: 76 75 75 (!) 55  Resp: $Remo'16 18 19 18  'yBGwr$ Temp: 97.8 F (36.6 C) 97.7 F (36.5 C) 98 F (36.7 C) 97.8 F (36.6 C)  TempSrc: Axillary Oral Oral Oral  SpO2:  99% 99% 99%  Weight:      Height:       General: alert, cooperative, and no distress Lochia: appropriate Uterine Fundus: firm Incision: N/A DVT Evaluation: No evidence of DVT seen on physical exam. Labs: Lab Results  Component  Value Date   WBC 8.1 04/04/2021   HGB 11.9 (L) 04/04/2021   HCT 36.9 04/04/2021   MCV 82.2 04/04/2021   PLT 295 04/04/2021   No flowsheet data found. Edinburgh Score: No flowsheet data found.   After visit meds:  Allergies as of 04/06/2021   No Known Allergies      Medication List     STOP taking these medications    Accu-Chek Guide test strip Generic drug: glucose blood   Accu-Chek Softclix Lancets lancets   cefadroxil 500 MG capsule Commonly known as: DURICEF       TAKE these medications    Accu-Chek Guide w/Device Kit 1 Device by Does not apply route 4 (four) times daily.   acetaminophen 325 MG tablet Commonly known as: Tylenol Take 2 tablets (650 mg total) by mouth every 4 (four) hours as needed for up to 180 doses (for pain scale < 4).   albuterol 108 (90 Base) MCG/ACT inhaler Commonly known as: VENTOLIN HFA Inhale into the lungs every 6 (six) hours as needed for wheezing or shortness of breath.   ferrous sulfate 324 (65 Fe) MG Tbec Take 1 tablet (325 mg total) by mouth every other day.   ibuprofen 600 MG tablet Commonly known as: ADVIL Take 1 tablet (600 mg total) by mouth every 6 (six) hours.   norethindrone 0.35 MG tablet Commonly known as: Ortho  Micronor Take 1 tablet (0.35 mg total) by mouth daily. Start when baby is four weeks old   oseltamivir 75 MG capsule Commonly known as: TAMIFLU Take 1 capsule (75 mg total) by mouth every 12 (twelve) hours for 7 doses.   Vitafol Ultra 29-0.6-0.4-200 MG Caps Take 1 capsule by mouth daily before breakfast.         Discharge home in stable condition Infant Feeding: Breast Infant Disposition:home with mother Discharge instruction: per After Visit Summary and Postpartum booklet. Activity: Advance as tolerated. Pelvic rest for 6 weeks.  Diet: routine diet Future Appointments: Future Appointments  Date Time Provider [place]  04/09/2021  1:50 PM Shelly Bombard, MD [facility] None   04/12/2021  6:45 AM [facility] SCHED ROOM [facility] None   Follow up Visit:   Please schedule this patient for a In person postpartum visit in 4 weeks with the following provider: Any provider. Additional Postpartum F/U:2 hour GTT  Low risk pregnancy complicated by: GDM Delivery mode:  Vaginal, Vacuum Neurosurgeon)  Anticipated Birth Control:  POPs   Mallie Snooks, [place], MSN, CNM Certified Nurse Midwife, Product/process development scientist for [employer], [place]

## 2021-04-05 NOTE — Anesthesia Postprocedure Evaluation (Signed)
Anesthesia Post Note  Patient: Cheryl Kline  Procedure(s) Performed: AN AD HOC LABOR EPIDURAL     Patient location during evaluation: Mother Baby Anesthesia Type: Epidural Level of consciousness: awake and alert and oriented Pain management: satisfactory to patient Vital Signs Assessment: post-procedure vital signs reviewed and stable Respiratory status: respiratory function stable Cardiovascular status: stable Postop Assessment: no headache, no backache, epidural receding, patient able to bend at knees, no signs of nausea or vomiting and adequate PO intake Anesthetic complications: no   No notable events documented.  Last Vitals:  Vitals:   04/05/21 0615 04/05/21 0847  BP: 120/80 103/80  Pulse: 65 64  Resp: 18 16  Temp: 36.7 C 36.6 C  SpO2:      Last Pain:  Vitals:   04/05/21 0955  TempSrc:   PainSc: 3    Pain Goal: Patients Stated Pain Goal: 0 (04/04/21 1925)                 Karleen Dolphin

## 2021-04-05 NOTE — Progress Notes (Signed)
Cheryl Kline is a 20 y.o. G1P0 at [redacted]w[redacted]d admitted for labor  Subjective: Despite epidural adjustments, pain control is still not optimal. No other concerns.  Objective: BP 122/70   Pulse (!) 57   Temp 98.2 F (36.8 C) (Oral)   Resp 16   Ht 5' (1.524 m)   Wt 70.3 kg   LMP 06/22/2020   SpO2 99%   BMI 30.27 kg/m  No intake/output data recorded. Total I/O In: -  Out: 800 [Urine:800]  FHT:  FHR: 120 bpm, variability: moderate,  accelerations:  Present,  decelerations:  none UC:   regular, every 3-4 minutes SVE:   Dilation: 9 Effacement (%): 80 Station: Plus 1 Exam by:: Brandy Hale, RN   Assessment / Plan: Spontaneous labor, progressing normally.   Labor: Progressing normally.  Will be in second stage soon. Pain Control:  Epidural, boluses being given to help with pain.  I/D:  + Influenza A, on Tamiflu. Droplet precautions instituted. No respiratory distress, no fevers currently.  A1GDM: Stable blood sugars, will continue to check q2h. Fetal Wellbeing:  Category I.   GBS Status: Positive, receiving PCN prophylaxis.  Anticipated MOD:   Hopeful for vaginal delivery   Jaynie Collins, MD 04/05/2021, 2:48 AM

## 2021-04-05 NOTE — Lactation Note (Signed)
This note was copied from a baby's chart. Lactation Consultation Note  Patient Name: Cheryl Kline [id number] Date: 04/05/2021 Reason for consult: L&D Initial assessment;Primapara;Term;1st time breastfeeding Age:22 hours   L&D Initial Consult:  Visited with family < 1 hour after birth Baby awake and alert; assisted to latch easily, however, he was not interested in initiating a suck.  Reassured parents that lactation services will be available on the M/B unit.  Allowed time for family bonding.  Father and grandmother present.   Maternal Data    Feeding Mother's Current Feeding Choice: Breast Milk  LATCH Score Latch: Too sleepy or reluctant, no latch achieved, no sucking elicited.  Audible Swallowing: None  Type of Nipple: Everted at rest and after stimulation  Comfort (Breast/Nipple): Soft / non-tender  Hold (Positioning): Assistance needed to correctly position infant at breast and maintain latch.  LATCH Score: 5   Lactation Tools Discussed/Used    Interventions Interventions: Assisted with latch;Skin to skin  Discharge    Consult Status Consult Status: Follow-up from L&D    [name] 04/05/2021, 4:02 AM

## 2021-04-06 MED ORDER — NORETHINDRONE 0.35 MG PO TABS: 1.0000 | ORAL_TABLET | Freq: Every day | ORAL | 11 refills | Status: AC

## 2021-04-06 MED ORDER — ACETAMINOPHEN 325 MG PO TABS
650.0000 mg | ORAL_TABLET | ORAL | 0 refills | Status: AC | PRN
Start: 2021-04-06 — End: ?

## 2021-04-06 MED ORDER — IBUPROFEN 600 MG PO TABS: 600.0000 mg | ORAL_TABLET | Freq: Four times a day (QID) | ORAL | 0 refills | Status: DC

## 2021-04-06 MED ORDER — OSELTAMIVIR PHOSPHATE 75 MG PO CAPS: 75.0000 mg | ORAL_CAPSULE | Freq: Two times a day (BID) | ORAL | 0 refills | Status: AC

## 2021-04-06 NOTE — Discharge Instructions (Signed)

## 2021-04-09 ENCOUNTER — Encounter: Payer: Medicaid Other | Admitting: Obstetrics

## 2021-04-12 ENCOUNTER — Inpatient Hospital Stay (HOSPITAL_COMMUNITY): Admission: AD | Admit: 2021-04-12 | Payer: Medicaid Other | Source: Home / Self Care | Admitting: Family Medicine

## 2021-04-12 ENCOUNTER — Inpatient Hospital Stay (HOSPITAL_COMMUNITY): Payer: Medicaid Other

## 2021-04-18 ENCOUNTER — Telehealth (HOSPITAL_COMMUNITY): Payer: Self-pay

## 2021-04-18 NOTE — Telephone Encounter (Signed)
"  I'm doing good. I think everything is going fine." Patient declines questions or concerns about her healing.  "He is good. He is asleep right now. He has been cluster feeding." RN explained cluster feeding and supply and demand. "He sleeps in his crib." RN reviewed ABC's of safe sleep with patient. Patient declines any questions or concerns about baby.  EPDS score is 0.  Marcelino Duster [facility] 04/18/2021,1728

## 2021-05-17 ENCOUNTER — Ambulatory Visit (INDEPENDENT_AMBULATORY_CARE_PROVIDER_SITE_OTHER): Payer: Medicaid Other | Admitting: Obstetrics and Gynecology

## 2021-05-17 ENCOUNTER — Other Ambulatory Visit: Payer: Self-pay

## 2021-05-17 ENCOUNTER — Encounter: Payer: Self-pay | Admitting: Obstetrics and Gynecology

## 2021-05-17 ENCOUNTER — Other Ambulatory Visit (HOSPITAL_COMMUNITY)
Admission: RE | Admit: 2021-05-17 | Discharge: 2021-05-17 | Disposition: A | Discharge status: Home / Self Care | Payer: Medicaid Other | Source: Ambulatory Visit | Attending: Obstetrics and Gynecology | Admitting: Obstetrics and Gynecology

## 2021-05-17 ENCOUNTER — Other Ambulatory Visit: Payer: Medicaid Other

## 2021-05-17 VITALS — BP 107/70 | HR 80 | Ht 60.0 in | Wt 134.0 lb

## 2021-05-17 DIAGNOSIS — Z3401 Encounter for supervision of normal first pregnancy, first trimester: Secondary | ICD-10-CM

## 2021-05-17 NOTE — Progress Notes (Signed)
° ° °  Post Partum Visit Note  Cheryl Kline is a 21 y.o. [id number] female who presents for a postpartum visit. She is 6 weeks postpartum following a vacuum-assisted vaginal delivery.  I have fully reviewed the prenatal and intrapartum course. The delivery was at 39 gestational weeks.  Anesthesia: local and epidural. Postpartum course has been uncomplicated. Baby is doing well yes. Baby is feeding by breast. Bleeding no bleeding. Bowel function is normal. Bladder function is normal. Patient is sexually active. Contraception method is oral progesterone-only contraceptive. Postpartum depression screening: negative.   The pregnancy intention screening data noted above was reviewed. Potential methods of contraception were discussed. The patient elected to proceed with No data recorded.    Health Maintenance Due  Topic Date Due   COVID-19 Vaccine (1) Never done   URINE MICROALBUMIN  Never done   HPV VACCINES (1 - 2-dose series) Never done   PAP-Cervical Cytology Screening  Never done   PAP SMEAR-Modifier  Never done    Medical record  Review of Systems Pertinent items are noted in HPI.  Objective:  LMP 06/22/2020    General:  alert   Breasts:  not indicated  Lungs: clear to auscultation bilaterally  Heart:  regular rate and rhythm, S1, S2 normal, no murmur, click, rub or gallop  Abdomen: soft, non-tender; bowel sounds normal; no masses,  no organomegaly   Wound NA  GU exam:  normal       Assessment:    There are no diagnoses linked to this encounter.  Nl postpartum exam.  H/O GDM  Plan:   Essential components of care per ACOG recommendations:  1.  Mood and well being: Patient with negative depression screening today. Reviewed local resources for support.  - Patient tobacco use? No.   - hx of drug use? No.    2. Infant care and feeding:  -Patient currently breastmilk feeding? yes  -Social determinants of health (SDOH) reviewed in EPIC. No concerns   3. Sexuality,  contraception and birth spacing - Patient does not want a pregnancy in the next year.  Desired family size is uncertain  - Reviewed forms of contraception in tiered fashion. Patient desired oral progesterone-only contraceptive today.   - Discussed birth spacing of 18 months  4. Sleep and fatigue -Encouraged family/partner/community support of 4 hrs of uninterrupted sleep to help with mood and fatigue  5. Physical Recovery  - Discussed patients delivery and complications. She describes her labor as good. - Patient had a Vaginal, no problems at delivery. Patient had a 1st degree laceration. Perineal healing reviewed. Patient expressed understanding - Patient has urinary incontinence? No. - Patient is safe to resume physical and sexual activity  6.  Health Maintenance - HM due items addressed Yes - Last pap smear No results found for: DIAGPAP Pap smear done at today's visit.  -Breast Cancer screening indicated? No.   7. Chronic Disease/Pregnancy Condition follow up: None  - PCP follow up  Nettie Elm, MD [facility], [facility]

## 2021-05-17 NOTE — Addendum Note (Signed)
Addended by: Harrel Lemon on: 05/17/2021 10:12 AM   Modules accepted: Orders

## 2021-05-17 NOTE — Patient Instructions (Signed)

## 2021-05-18 LAB — GLUCOSE TOLERANCE, 2 HOURS
Glucose, 2 hour: 122 mg/dL (ref 70–139)
Glucose, GTT - Fasting: 68 mg/dL — ABNORMAL LOW (ref 70–99)

## 2021-05-20 LAB — CYTOLOGY - PAP
Adequacy: ABSENT
Diagnosis: NEGATIVE

## 2022-08-07 IMAGING — US US MFM OB LIMITED
1 series · 15 of 22 positions shown · non-contrast
Comparison: none

[Series 1: us mfm ob limited · 15 of 22 slices shown]
[im 1/22]
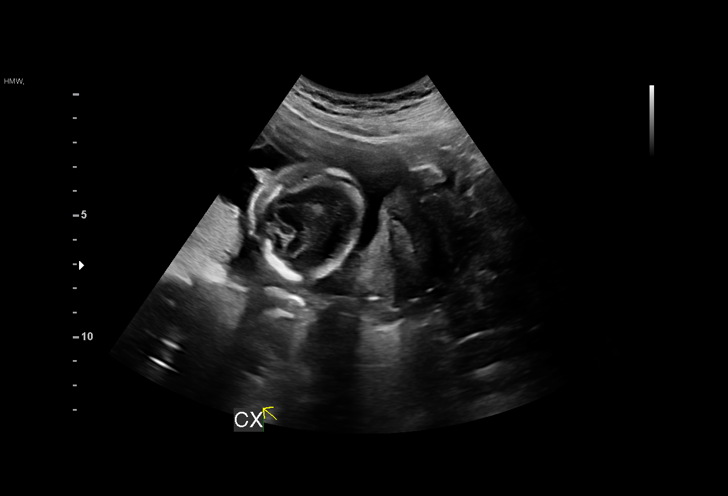
[im 3/22]
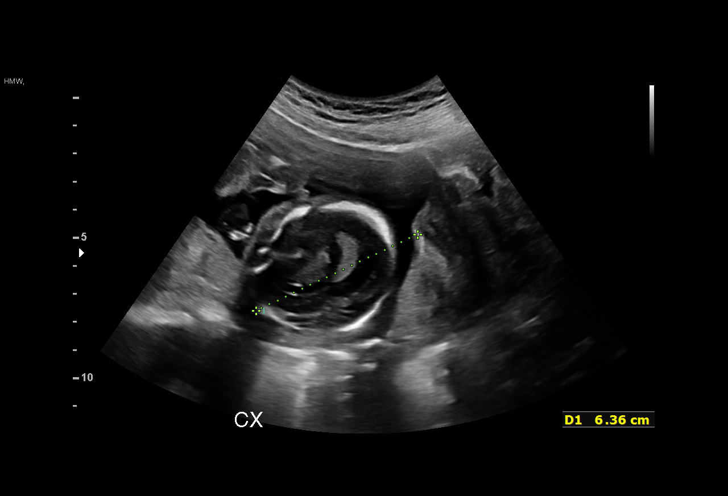
[im 4/22]
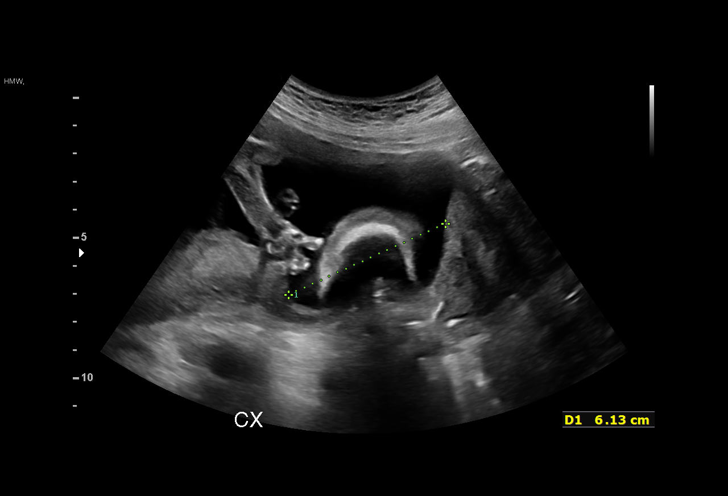
[im 6/22]
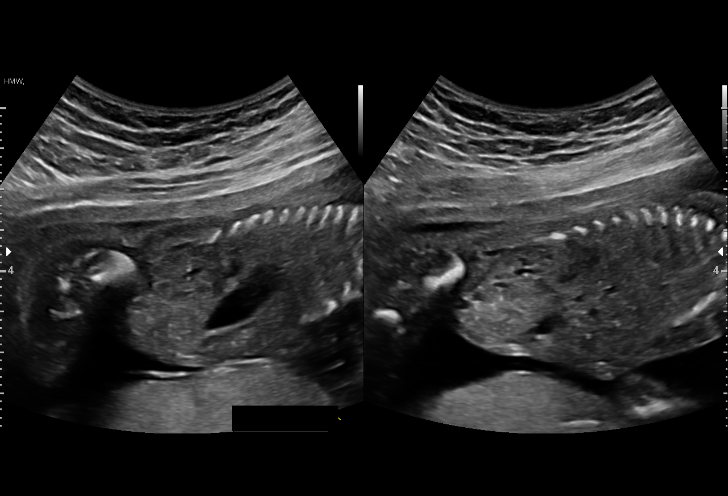
[im 7/22]
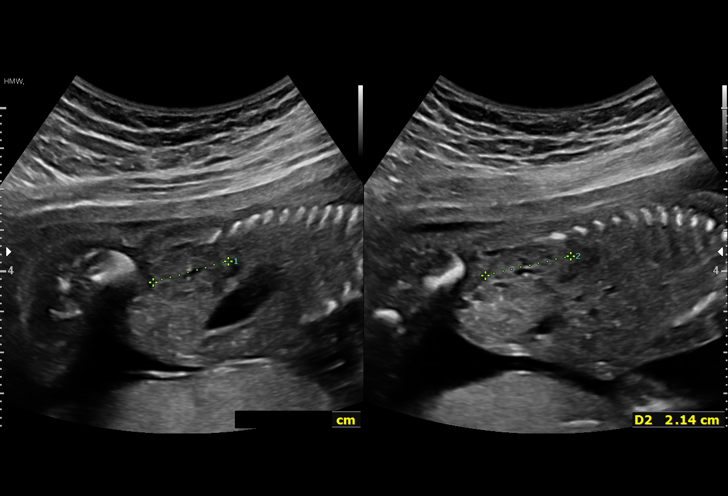
[im 9/22]
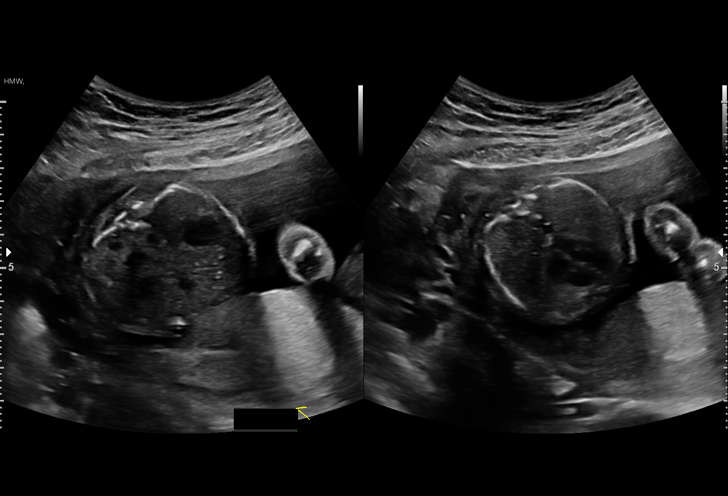
[im 10/22]
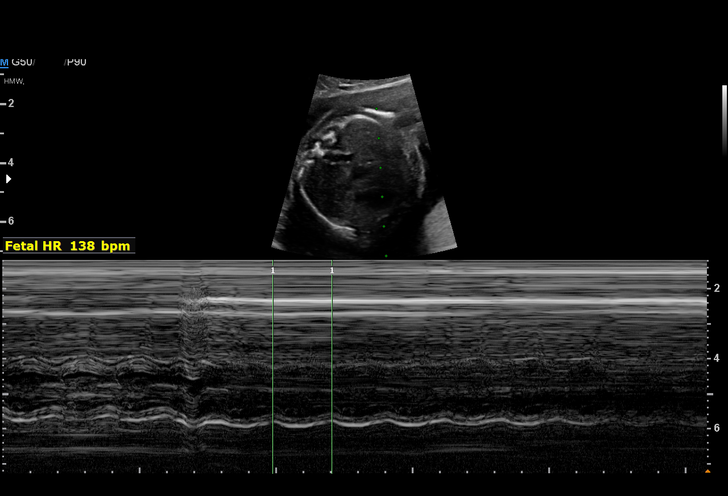
[im 12/22]
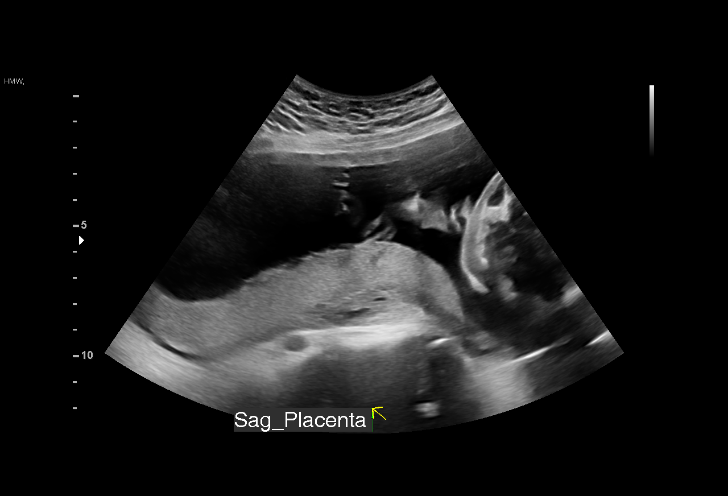
[im 13/22]
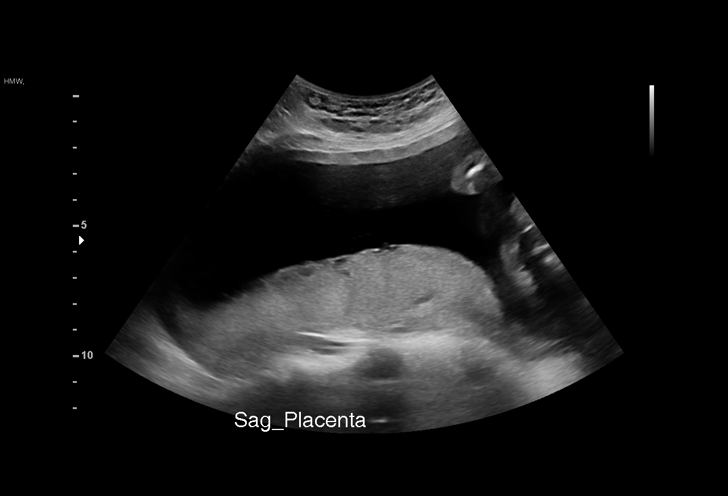
[im 14/22]
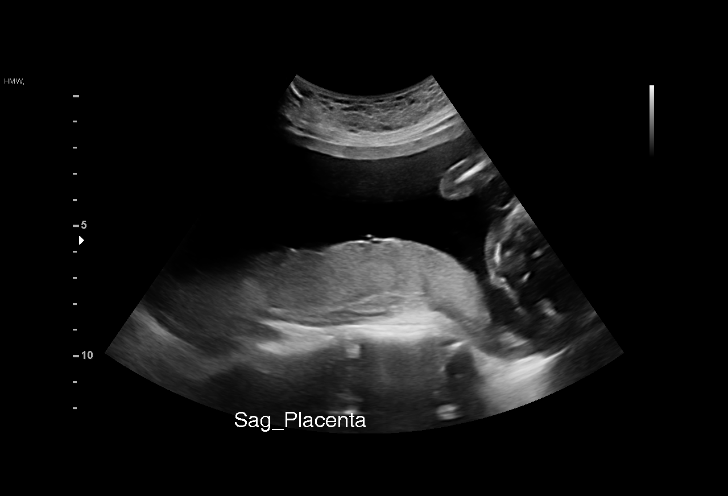
[im 16/22]
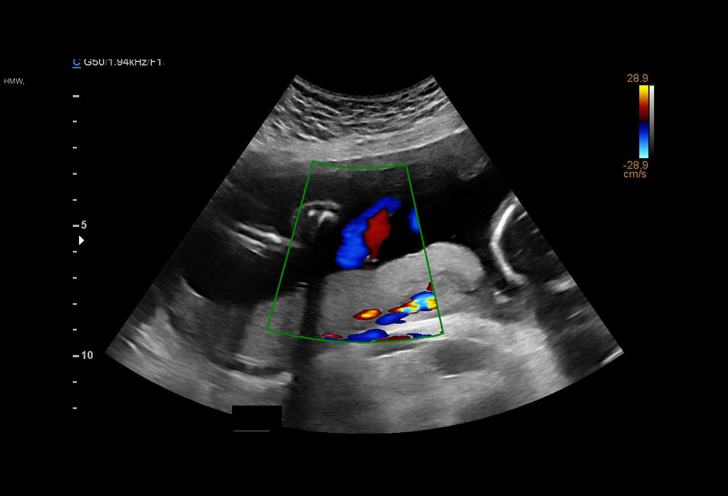
[im 17/22]
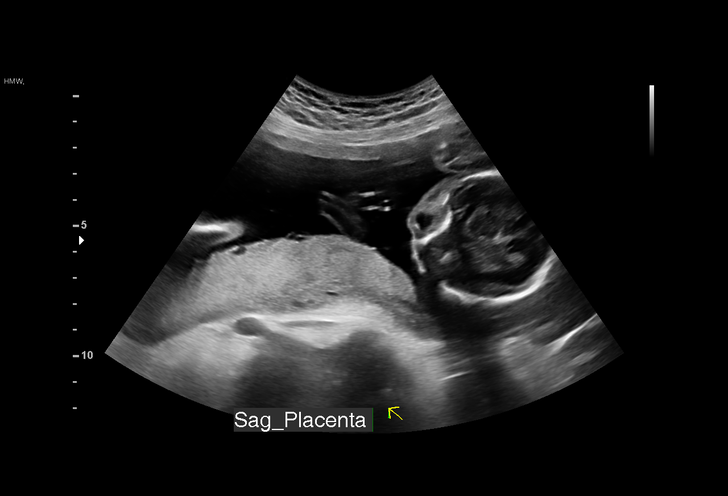
[im 19/22]
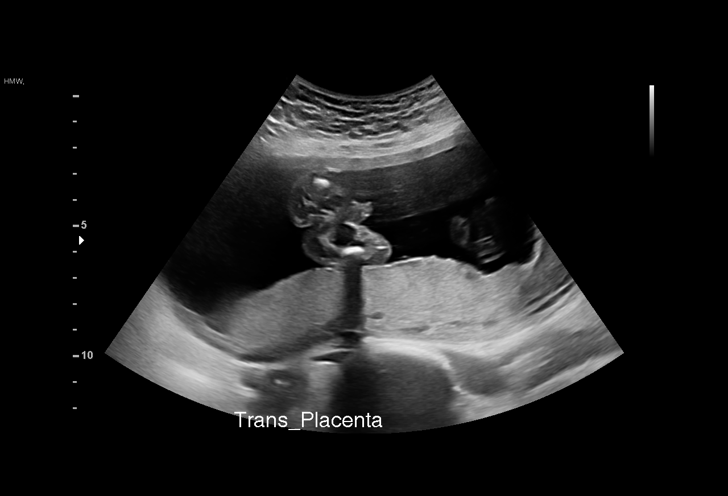
[im 20/22]
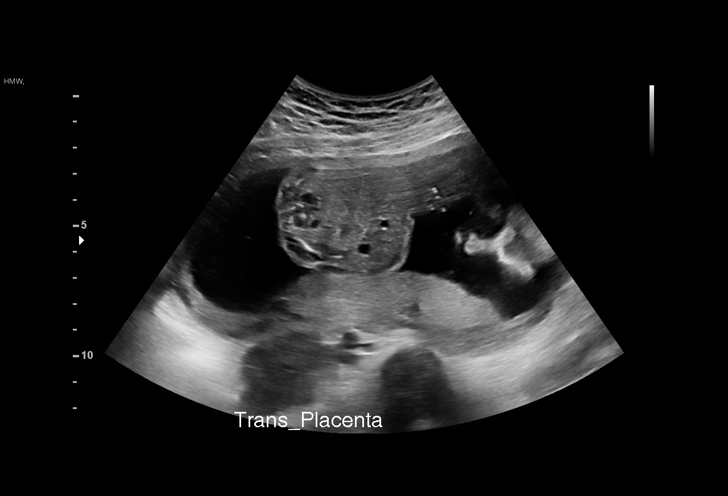
[im 22/22]
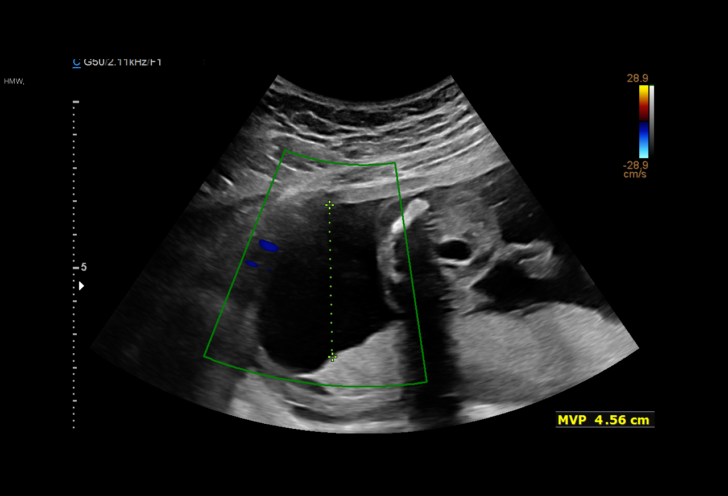

[15 of 22 positions shown; findings below may reference images not displayed]

Indications

 Vaginal bleeding in pregnancy, second
 trimester
 19 weeks gestation of pregnancy
 Encounter for antenatal screening,
 unspecified
Fetal Evaluation

 Num Of Fetuses:         1
 Fetal Heart Rate(bpm):  138
 Cardiac Activity:       Observed
 Presentation:           Cephalic
 Placenta:               Posterior
 P. Cord Insertion:      Visualized, central

 Amniotic Fluid
 AFI FV:      Within normal limits

                             Largest Pocket(cm)

OB History

 Gravidity:    1         Term:   0        Prem:   0        SAB:   0
 TOP:          0       Ectopic:  0        Living: 0
Gestational Age

 Clinical EDD:  19w 6d                                        EDD:   04/12/21
 Best:          19w 6d     Det. By:  Clinical EDD             EDD:   04/12/21
Anatomy

 Thoracic:              Appears normal         Kidneys:                Appear normal
 Stomach:               Appears normal, left   Bladder:                Appears normal
                        sided
Cervix Uterus Adnexa

 Cervix
 Length:              4  cm.
 Closed

 Uterus
 No abnormality visualized.

 Right Ovary
 No adnexal mass visualized.

 Left Ovary
 No adnexal mass visualized.

 Cul De Sac
 No free fluid seen.

 Adnexa
 No abnormality visualized.
Impression

 Limited exam due to vaginal bleeding
 Good fetal movement and amniotic fluid volume
 No evidence of placenta previa, abruption or shortened
 cervix.
Recommendations

 All questions answered.
